# Patient Record
Sex: Male | Born: 1980 | Race: Black or African American | Hispanic: No | Marital: Single | Smoking: Current every day smoker
Health system: Southern US, Community
[De-identification: ages and names within clinical notes are randomized; demographics above are authoritative.]

## PROBLEM LIST (undated history)

## (undated) DIAGNOSIS — W3400XA Accidental discharge from unspecified firearms or gun, initial encounter: Secondary | ICD-10-CM

## (undated) DIAGNOSIS — F1910 Other psychoactive substance abuse, uncomplicated: Secondary | ICD-10-CM

## (undated) DIAGNOSIS — D751 Secondary polycythemia: Secondary | ICD-10-CM

## (undated) DIAGNOSIS — Z720 Tobacco use: Secondary | ICD-10-CM

## (undated) DIAGNOSIS — I82409 Acute embolism and thrombosis of unspecified deep veins of unspecified lower extremity: Secondary | ICD-10-CM

## (undated) DIAGNOSIS — F1010 Alcohol abuse, uncomplicated: Secondary | ICD-10-CM

## (undated) DIAGNOSIS — D759 Disease of blood and blood-forming organs, unspecified: Secondary | ICD-10-CM

## (undated) HISTORY — PX: OTHER SURGICAL HISTORY: SHX169

---
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
## 2007-08-29 ENCOUNTER — Emergency Department (HOSPITAL_COMMUNITY): Admission: EM | Admit: 2007-08-29 | Discharge: 2007-08-29 | Payer: Self-pay | Admitting: Emergency Medicine

## 2008-06-17 ENCOUNTER — Emergency Department (HOSPITAL_COMMUNITY): Admission: EM | Admit: 2008-06-17 | Discharge: 2008-06-17 | Payer: Self-pay | Admitting: Emergency Medicine

## 2010-04-21 LAB — GC/CHLAMYDIA PROBE AMP, GENITAL
Chlamydia, DNA Probe: NEGATIVE
GC Probe Amp, Genital: NEGATIVE

## 2010-04-21 LAB — URINALYSIS, ROUTINE W REFLEX MICROSCOPIC
Ketones, ur: 15 mg/dL — AB
Nitrite: NEGATIVE
Specific Gravity, Urine: 1.029 (ref 1.005–1.030)
Urobilinogen, UA: 1 mg/dL (ref 0.0–1.0)
pH: 6 (ref 5.0–8.0)

## 2010-04-21 LAB — DIFFERENTIAL
Basophils Absolute: 0.1 10*3/uL (ref 0.0–0.1)
Basophils Relative: 2 % — ABNORMAL HIGH (ref 0–1)
Lymphocytes Relative: 27 % (ref 12–46)
Neutro Abs: 3.6 10*3/uL (ref 1.7–7.7)

## 2010-04-21 LAB — CBC
Platelets: 129 10*3/uL — ABNORMAL LOW (ref 150–400)
RDW: 13 % (ref 11.5–15.5)

## 2010-04-21 LAB — BASIC METABOLIC PANEL
BUN: 9 mg/dL (ref 6–23)
Calcium: 9.4 mg/dL (ref 8.4–10.5)
GFR calc non Af Amer: >60 mL/min (ref >60)
Potassium: 3.8 mEq/L (ref 3.5–5.1)
Sodium: 141 mEq/L (ref 135–145)

## 2010-04-21 LAB — D-DIMER, QUANTITATIVE: D-Dimer, Quant: <0.22 ug/mL-FEU (ref 0.00–0.48)

## 2011-05-05 ENCOUNTER — Emergency Department (HOSPITAL_COMMUNITY)
Admission: EM | Admit: 2011-05-05 | Discharge: 2011-05-05 | Disposition: A | Discharge status: Home / Self Care | Payer: Self-pay | Attending: Emergency Medicine | Admitting: Emergency Medicine

## 2011-05-05 ENCOUNTER — Emergency Department (HOSPITAL_COMMUNITY): Payer: Self-pay

## 2011-05-05 ENCOUNTER — Encounter (HOSPITAL_COMMUNITY): Payer: Self-pay

## 2011-05-05 DIAGNOSIS — R0602 Shortness of breath: Secondary | ICD-10-CM | POA: Insufficient documentation

## 2011-05-05 DIAGNOSIS — R079 Chest pain, unspecified: Secondary | ICD-10-CM | POA: Insufficient documentation

## 2011-05-05 DIAGNOSIS — F17200 Nicotine dependence, unspecified, uncomplicated: Secondary | ICD-10-CM | POA: Insufficient documentation

## 2011-05-05 DIAGNOSIS — R002 Palpitations: Secondary | ICD-10-CM | POA: Insufficient documentation

## 2011-05-05 DIAGNOSIS — F1490 Cocaine use, unspecified, uncomplicated: Secondary | ICD-10-CM

## 2011-05-05 DIAGNOSIS — F1410 Cocaine abuse, uncomplicated: Secondary | ICD-10-CM | POA: Insufficient documentation

## 2011-05-05 LAB — POCT I-STAT TROPONIN I: Troponin i, poc: 0 ng/mL (ref 0.00–0.08)

## 2011-05-05 LAB — POCT I-STAT, CHEM 8
Chloride: 104 mEq/L (ref 96–112)
Glucose, Bld: 66 mg/dL — ABNORMAL LOW (ref 70–99)
HCT: 63 % — ABNORMAL HIGH (ref 39.0–52.0)
Hemoglobin: 21.4 g/dL (ref 13.0–17.0)
Potassium: 4.3 mEq/L (ref 3.5–5.1)
Sodium: 139 mEq/L (ref 135–145)

## 2011-05-05 LAB — DIFFERENTIAL
Basophils Absolute: 0 10*3/uL (ref 0.0–0.1)
Basophils Relative: 0 % (ref 0–1)
Eosinophils Absolute: 0.1 10*3/uL (ref 0.0–0.7)
Monocytes Relative: 10 % (ref 3–12)
Neutro Abs: 4.6 10*3/uL (ref 1.7–7.7)
Neutrophils Relative %: 71 % (ref 43–77)

## 2011-05-05 LAB — CBC
Hemoglobin: 21 g/dL — ABNORMAL HIGH (ref 13.0–17.0)
MCH: 34.4 pg — ABNORMAL HIGH (ref 26.0–34.0)
MCHC: 36.1 g/dL — ABNORMAL HIGH (ref 30.0–36.0)
Platelets: 152 10*3/uL (ref 150–400)

## 2011-05-05 NOTE — ED Notes (Signed)
Patient returned from X-ray 

## 2011-05-05 NOTE — ED Provider Notes (Signed)
4:00 PM Assumed care of patient from Juanita Laster, PA-C. Patient came in today with a chief complaint of chest pain and palpitations.  He has a history of this in the past.  He admits to using cocaine occasionally. Plan is for patient to be discharged if labs and CXR come back negative.  Reassessed patient.  Patient reports that his chest pain and palpitations have improved at this time.  Patient will be discharged home.  Patient in agreement with plan.  Patient informed of high hemoglobin and RBC.  Patient reports that he has had this in the past and was referred to a specialist and told he may need a bone marrow biopsy, which he did not get.  Pascal Lux [place], PA-C 05/05/11 2130

## 2011-05-05 NOTE — ED Notes (Signed)
Pt in from home with acute onset of chest pain states sob denies pain radiating denies nausea pt states pain is pressure like at the present time

## 2011-05-05 NOTE — ED Notes (Signed)
Critical value given to Dr. Effie Shy (I-Stat)

## 2011-05-05 NOTE — Discharge Instructions (Signed)
Read instructions below for reasons to return to the Emergency Department. It is recommended that your follow up with your Primary Care Doctor in regards to today's visit. If you do not have a doctor, use the resource guide listed below to help you find one. Avoid using cocaine.  Also make sure that you follow up with a primary care physician for your high hemoglobin and high red blood cell count.  You will most likely need a bone marrow.  Chest Pain (Nonspecific)  HOME CARE INSTRUCTIONS  For the next few days, avoid physical activities that bring on chest pain. Continue physical activities as directed.  Do not smoke cigarettes or drink alcohol until your symptoms are gone.  Only take over-the-counter or prescription medicine for pain, discomfort, or fever as directed by your caregiver.  Follow your caregiver's suggestions for further testing if your chest pain does not go away.  Keep any follow-up appointments you made. If you do not go to an appointment, you could develop lasting (chronic) problems with pain. If there is any problem keeping an appointment, you must call to reschedule.  SEEK MEDICAL CARE IF:  You think you are having problems from the medicine you are taking. Read your medicine instructions carefully.  Your chest pain does not go away, even after treatment.  You develop a rash with blisters on your chest.  SEEK IMMEDIATE MEDICAL CARE IF:  You have increased chest pain or pain that spreads to your arm, neck, jaw, back, or belly (abdomen).  You develop shortness of breath, an increasing cough, or you are coughing up blood.  You have severe back or abdominal pain, feel sick to your stomach (nauseous) or throw up (vomit).  You develop severe weakness, fainting, or chills.  You have an oral temperature above 102 F (38.9 C), not controlled by medicine.   THIS IS AN EMERGENCY. Do not wait to see if the pain will go away. Get medical help at once. Call your local emergency services  (911 in [place] Do not drive yourself to the hospital.   RESOURCE GUIDE  Dental Problems  Patients with Medicaid: [facility] [phone] [address]                                           [phone] [address] Phone:  [phone]                                                  Phone:  [phone]  If unable to pay or uninsured, contact:  [facility] or [facility]. to become qualified for the adult dental clinic.  Chronic Pain Problems Contact [facility]  [phone] Patients need to be referred by their primary care doctor.  Insufficient Money for Medicine Contact [employer]:  call "211" or [facility] [phone].  No Primary Care Doctor Call [facility]  [phone] Other agencies that provide inexpensive medical care    [facility]  [phone]    [facility]  [phone]    [facility]  [phone]  Women's Clinic  [phone]    [facility]  [phone]    [facility]  516-698-9016  Psychological Services [facility]  [phone] [facility]  [phone] [facility]   [phone] (emergency services (502)[phone])  Substance Abuse Resources [facility]  [phone] [facility] [phone] [facility] [place] [phone] Floydene Flock ([phone] Residential & Outpatient Substance Abuse Program  [phone]  Abuse/Neglect [facility] Child Abuse Hotline (405) 513-7807 [facility] Child Abuse Hotline 917-742-0416 (After Hours)  Emergency Shelter [facility] 502-245-2782  [facility] [phone] [facility] 9366923184  MRSA Hotline #:   365-821-7290    [facility] Resources  [facility]     [employer]                          [facility] Dept. [address] [place]                       [address]      [address]  Blondell Reveal Phone:  [phone]                                   Phone:  [phone]                 Phone:  [phone]  [facility] Phone:  [phone]  [facility] Child Abuse Hotline [phone])617-0038 [phone] (After Hours)

## 2011-05-05 NOTE — ED Provider Notes (Signed)
History     CSN: [id number]  Arrival date & time 05/05/11  1312   First MD Initiated Contact with Patient 05/05/11 1432      Chief Complaint  Patient presents with  . Chest Pain    (Consider location/radiation/quality/duration/timing/severity/associated sxs/prior treatment) Patient is a 31 y.o. male presenting with chest pain. The history is provided by the patient.  Chest Pain The chest pain began 3 - 5 hours ago. Associated with: He woke this morning with central chest discomfort and "skipping beats". The chest discomfort has resolved but palpitations persist.  Primary symptoms include shortness of breath and palpitations. Pertinent negatives for primary symptoms include no fever.  The palpitations also occurred with shortness of breath. Associated symptoms comments: He states that he has these episodes approximately 3 times per week. 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 He admits to recent and regular use of cocaine, last used 2 days ago.Marland Kitchen     History reviewed. No pertinent past medical history.  History reviewed. No pertinent past surgical history.  No family history on file.  History  Substance Use Topics  . Smoking status: Current Everyday Smoker  . Smokeless tobacco: Not on file  . Alcohol Use: Yes      Review of Systems  Constitutional: Negative for fever and chills.  Respiratory: Positive for shortness of breath.   Cardiovascular: Positive for chest pain and palpitations.  Gastrointestinal: Negative.   Musculoskeletal: Negative.   Skin: Negative.   Neurological: Negative.     Allergies  Food allergy formula  Home Medications   Current Outpatient Rx  Name Route Sig Dispense Refill  . ASPIRIN 325 MG PO TABS Oral Take 325 mg by mouth daily.      BP 121/83  Pulse 77  Resp 34  SpO2 98%  Physical Exam  ED Course  Procedures  (including critical care time)   Labs Reviewed  CBC  DIFFERENTIAL   No results found.   No diagnosis found.    MDM          Rodena Medin, PA-C 05/05/11 1527

## 2011-05-07 NOTE — ED Provider Notes (Signed)
Medical screening examination/treatment/procedure(s) were performed by non-physician practitioner and as supervising physician I was immediately available for consultation/collaboration.  Flint Melter, MD 05/07/11 [phone]

## 2011-05-07 NOTE — ED Provider Notes (Signed)
Medical screening examination/treatment/procedure(s) were performed by non-physician practitioner and as supervising physician I was immediately available for consultation/collaboration.  Flint Melter, MD 05/07/11 1253

## 2012-09-29 ENCOUNTER — Emergency Department (HOSPITAL_COMMUNITY)
Admission: EM | Admit: 2012-09-29 | Discharge: 2012-09-29 | Disposition: A | Discharge status: Home / Self Care | Payer: Medicaid Other | Attending: Emergency Medicine | Admitting: Emergency Medicine

## 2012-09-29 ENCOUNTER — Encounter (HOSPITAL_COMMUNITY): Payer: Self-pay | Admitting: Emergency Medicine

## 2012-09-29 ENCOUNTER — Emergency Department (HOSPITAL_COMMUNITY): Payer: Medicaid Other

## 2012-09-29 DIAGNOSIS — Z7982 Long term (current) use of aspirin: Secondary | ICD-10-CM | POA: Insufficient documentation

## 2012-09-29 DIAGNOSIS — F1010 Alcohol abuse, uncomplicated: Secondary | ICD-10-CM | POA: Insufficient documentation

## 2012-09-29 DIAGNOSIS — D45 Polycythemia vera: Secondary | ICD-10-CM | POA: Insufficient documentation

## 2012-09-29 DIAGNOSIS — R000 Tachycardia, unspecified: Secondary | ICD-10-CM | POA: Insufficient documentation

## 2012-09-29 DIAGNOSIS — Z87828 Personal history of other (healed) physical injury and trauma: Secondary | ICD-10-CM | POA: Insufficient documentation

## 2012-09-29 DIAGNOSIS — R079 Chest pain, unspecified: Secondary | ICD-10-CM | POA: Insufficient documentation

## 2012-09-29 DIAGNOSIS — F1410 Cocaine abuse, uncomplicated: Secondary | ICD-10-CM | POA: Insufficient documentation

## 2012-09-29 DIAGNOSIS — F1210 Cannabis abuse, uncomplicated: Secondary | ICD-10-CM | POA: Insufficient documentation

## 2012-09-29 DIAGNOSIS — F17200 Nicotine dependence, unspecified, uncomplicated: Secondary | ICD-10-CM | POA: Insufficient documentation

## 2012-09-29 HISTORY — DX: Accidental discharge from unspecified firearms or gun, initial encounter: W34.00XA

## 2012-09-29 LAB — POCT I-STAT, CHEM 8
BUN: 6 mg/dL (ref 6–23)
Calcium, Ion: 1.03 mmol/L — ABNORMAL LOW (ref 1.12–1.23)
HCT: 69 % — ABNORMAL HIGH (ref 39.0–52.0)
Hemoglobin: 23.5 g/dL (ref 13.0–17.0)
Sodium: 139 mEq/L (ref 135–145)
TCO2: 22 mmol/L (ref 0–100)

## 2012-09-29 LAB — CBC WITH DIFFERENTIAL/PLATELET
Basophils Absolute: 0 10*3/uL (ref 0.0–0.1)
Basophils Relative: 0 % (ref 0–1)
Eosinophils Relative: 2 % (ref 0–5)
HCT: 65.6 % — ABNORMAL HIGH (ref 39.0–52.0)
Lymphocytes Relative: 30 % (ref 12–46)
MCHC: 36.4 g/dL — ABNORMAL HIGH (ref 30.0–36.0)
Monocytes Absolute: 0.8 10*3/uL (ref 0.1–1.0)
Neutro Abs: 3.1 10*3/uL (ref 1.7–7.7)
Platelets: 108 10*3/uL — ABNORMAL LOW (ref 150–400)
RDW: 14 % (ref 11.5–15.5)
WBC: 5.7 10*3/uL (ref 4.0–10.5)

## 2012-09-29 LAB — BASIC METABOLIC PANEL
Chloride: 97 mEq/L (ref 96–112)
Creatinine, Ser: 0.9 mg/dL (ref 0.50–1.35)
GFR calc Af Amer: >90 mL/min (ref >90)
Potassium: 4.2 mEq/L (ref 3.5–5.1)
Sodium: 136 mEq/L (ref 135–145)

## 2012-09-29 LAB — HEPATIC FUNCTION PANEL
ALT: 44 U/L (ref 0–53)
Albumin: 3.6 g/dL (ref 3.5–5.2)
Indirect Bilirubin: 0.7 mg/dL (ref 0.3–0.9)
Total Protein: 7.7 g/dL (ref 6.0–8.3)

## 2012-09-29 LAB — POCT I-STAT TROPONIN I: Troponin i, poc: 0.01 ng/mL (ref 0.00–0.08)

## 2012-09-29 MED ORDER — ASPIRIN 81 MG PO CHEW: 81.0000 mg | CHEWABLE_TABLET | Freq: Every day | ORAL | Status: DC

## 2012-09-29 MED ORDER — LORAZEPAM 2 MG/ML IJ SOLN
1.0000 mg | Freq: Once | INTRAMUSCULAR | Status: AC
Start: 2012-09-29 — End: 2012-09-29
  Administered 2012-09-29: 1 mg via INTRAVENOUS
  Filled 2012-09-29: qty 1

## 2012-09-29 MED ORDER — ASPIRIN 325 MG PO TABS
325.0000 mg | ORAL_TABLET | Freq: Once | ORAL | Status: AC
  Administered 2012-09-29: 325 mg via ORAL
  Filled 2012-09-29: qty 1

## 2012-09-29 MED ORDER — SODIUM CHLORIDE 0.9 % IV BOLUS (SEPSIS)
1000.0000 mL | Freq: Once | INTRAVENOUS | Status: AC
  Administered 2012-09-29: 1000 mL via INTRAVENOUS
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
## 2012-09-29 NOTE — ED Notes (Signed)
Pt states he has intermittent numbness and tingling to face/neck and L arm x 2 weeks. Pt states he awoke from sleep tonight with "blank" feeling. Pt states he does drink, smoke marijuana and snort cocaine but does not feel this is related. Pt states he is having "twitching" to face.

## 2012-09-29 NOTE — ED Notes (Signed)
EKG given to EDP, Dierdre Highman, MD.

## 2012-09-29 NOTE — ED Provider Notes (Signed)
Medical screening examination/treatment/procedure(s) were performed by non-physician practitioner and as supervising physician I was immediately available for consultation/collaboration.  Sunnie Nielsen, MD 09/29/12 [phone]

## 2012-09-30 ENCOUNTER — Telehealth: Payer: Self-pay | Admitting: Internal Medicine

## 2012-09-30 ENCOUNTER — Observation Stay (HOSPITAL_COMMUNITY): Payer: Medicaid Other

## 2012-09-30 ENCOUNTER — Inpatient Hospital Stay (HOSPITAL_COMMUNITY)
Admission: EM | Admit: 2012-09-30 | Discharge: 2012-10-03 | DRG: 842 | Disposition: A | Discharge status: Home / Self Care | Payer: Medicaid Other | Attending: Family Medicine | Admitting: Family Medicine

## 2012-09-30 ENCOUNTER — Encounter (HOSPITAL_COMMUNITY): Payer: Self-pay | Admitting: *Deleted

## 2012-09-30 ENCOUNTER — Emergency Department (HOSPITAL_COMMUNITY): Payer: Medicaid Other

## 2012-09-30 DIAGNOSIS — F329 Major depressive disorder, single episode, unspecified: Secondary | ICD-10-CM | POA: Diagnosis present

## 2012-09-30 DIAGNOSIS — Z6826 Body mass index (BMI) 26.0-26.9, adult: Secondary | ICD-10-CM

## 2012-09-30 DIAGNOSIS — F411 Generalized anxiety disorder: Secondary | ICD-10-CM | POA: Diagnosis present

## 2012-09-30 DIAGNOSIS — D45 Polycythemia vera: Principal | ICD-10-CM

## 2012-09-30 DIAGNOSIS — Z720 Tobacco use: Secondary | ICD-10-CM

## 2012-09-30 DIAGNOSIS — F1910 Other psychoactive substance abuse, uncomplicated: Secondary | ICD-10-CM

## 2012-09-30 DIAGNOSIS — Z224 Carrier of infections with a predominantly sexual mode of transmission: Secondary | ICD-10-CM

## 2012-09-30 DIAGNOSIS — H539 Unspecified visual disturbance: Secondary | ICD-10-CM | POA: Diagnosis present

## 2012-09-30 DIAGNOSIS — D751 Secondary polycythemia: Secondary | ICD-10-CM

## 2012-09-30 DIAGNOSIS — F1210 Cannabis abuse, uncomplicated: Secondary | ICD-10-CM | POA: Diagnosis present

## 2012-09-30 DIAGNOSIS — F17200 Nicotine dependence, unspecified, uncomplicated: Secondary | ICD-10-CM | POA: Diagnosis present

## 2012-09-30 DIAGNOSIS — D72829 Elevated white blood cell count, unspecified: Secondary | ICD-10-CM | POA: Diagnosis present

## 2012-09-30 DIAGNOSIS — Z7982 Long term (current) use of aspirin: Secondary | ICD-10-CM

## 2012-09-30 DIAGNOSIS — F1410 Cocaine abuse, uncomplicated: Secondary | ICD-10-CM | POA: Diagnosis present

## 2012-09-30 DIAGNOSIS — F419 Anxiety disorder, unspecified: Secondary | ICD-10-CM

## 2012-09-30 DIAGNOSIS — F1010 Alcohol abuse, uncomplicated: Secondary | ICD-10-CM

## 2012-09-30 DIAGNOSIS — E440 Moderate protein-calorie malnutrition: Secondary | ICD-10-CM | POA: Diagnosis present

## 2012-09-30 DIAGNOSIS — F3289 Other specified depressive episodes: Secondary | ICD-10-CM | POA: Diagnosis present

## 2012-09-30 HISTORY — DX: Secondary polycythemia: D75.1

## 2012-09-30 LAB — CBC
Hemoglobin: 22.1 g/dL (ref 13.0–17.0)
MCV: 91.8 fL (ref 78.0–100.0)
Platelets: 133 10*3/uL — ABNORMAL LOW (ref 150–400)
RBC: 6.55 MIL/uL — ABNORMAL HIGH (ref 4.22–5.81)
WBC: 5.6 10*3/uL (ref 4.0–10.5)

## 2012-09-30 LAB — COMPREHENSIVE METABOLIC PANEL
ALT: 45 U/L (ref 0–53)
AST: 66 U/L — ABNORMAL HIGH (ref 0–37)
Albumin: 3.4 g/dL — ABNORMAL LOW (ref 3.5–5.2)
Calcium: 9.1 mg/dL (ref 8.4–10.5)
Sodium: 137 mEq/L (ref 135–145)
Total Protein: 7.1 g/dL (ref 6.0–8.3)

## 2012-09-30 LAB — HEMATOCRIT: HCT: 58.7 % — ABNORMAL HIGH (ref 39.0–52.0)

## 2012-09-30 LAB — URINALYSIS, ROUTINE W REFLEX MICROSCOPIC
Leukocytes, UA: NEGATIVE
Nitrite: NEGATIVE
Specific Gravity, Urine: 1.019 (ref 1.005–1.030)
pH: 6 (ref 5.0–8.0)

## 2012-09-30 LAB — POCT I-STAT TROPONIN I

## 2012-09-30 MED ORDER — HEPARIN SODIUM (PORCINE) 5000 UNIT/ML IJ SOLN
5000.0000 [IU] | Freq: Three times a day (TID) | INTRAMUSCULAR | Status: DC
  Administered 2012-09-30 – 2012-10-03 (×8): 5000 [IU] via SUBCUTANEOUS
  Filled 2012-09-30 (×11): qty 1

## 2012-09-30 MED ORDER — DEXTROSE-NACL 5-0.45 % IV SOLN
INTRAVENOUS | Status: DC
  Administered 2012-09-30: 980 mL via INTRAVENOUS
  Administered 2012-10-01 – 2012-10-03 (×2): via INTRAVENOUS

## 2012-09-30 MED ORDER — ADULT MULTIVITAMIN W/MINERALS CH
1.0000 | ORAL_TABLET | Freq: Every day | ORAL | Status: DC
  Administered 2012-09-30 – 2012-10-03 (×4): 1 via ORAL
  Filled 2012-09-30 (×4): qty 1

## 2012-09-30 MED ORDER — LORAZEPAM 2 MG/ML IJ SOLN
1.0000 mg | Freq: Four times a day (QID) | INTRAMUSCULAR | Status: DC | PRN
  Administered 2012-09-30 – 2012-10-01 (×2): 1 mg via INTRAVENOUS
  Filled 2012-09-30 (×2): qty 1

## 2012-09-30 MED ORDER — PNEUMOCOCCAL VAC POLYVALENT 25 MCG/0.5ML IJ INJ
0.5000 mL | INJECTION | INTRAMUSCULAR | Status: AC
  Filled 2012-09-30 (×2): qty 0.5

## 2012-09-30 MED ORDER — LORAZEPAM 1 MG PO TABS
1.0000 mg | ORAL_TABLET | Freq: Four times a day (QID) | ORAL | Status: DC | PRN
  Administered 2012-10-01: 1 mg via ORAL
  Filled 2012-09-30: qty 1

## 2012-09-30 MED ORDER — LORAZEPAM 2 MG/ML IJ SOLN
1.0000 mg | Freq: Once | INTRAMUSCULAR | Status: AC
  Administered 2012-09-30: 1 mg via INTRAVENOUS
  Filled 2012-09-30: qty 1

## 2012-09-30 MED ORDER — FOLIC ACID 1 MG PO TABS
1.0000 mg | ORAL_TABLET | Freq: Every day | ORAL | Status: DC
  Administered 2012-09-30 – 2012-10-03 (×4): 1 mg via ORAL
  Filled 2012-09-30 (×4): qty 1

## 2012-09-30 MED ORDER — ACETAMINOPHEN 500 MG PO TABS
500.0000 mg | ORAL_TABLET | Freq: Four times a day (QID) | ORAL | Status: DC | PRN
  Administered 2012-09-30 – 2012-10-02 (×2): 500 mg via ORAL
  Filled 2012-09-30 (×3): qty 1

## 2012-09-30 MED ORDER — ASPIRIN 81 MG PO CHEW
81.0000 mg | CHEWABLE_TABLET | Freq: Every day | ORAL | Status: DC
  Administered 2012-09-30 – 2012-10-03 (×4): 81 mg via ORAL
  Filled 2012-09-30 (×4): qty 1

## 2012-09-30 MED ORDER — THIAMINE HCL 100 MG/ML IJ SOLN
100.0000 mg | Freq: Every day | INTRAMUSCULAR | Status: DC
  Filled 2012-09-30 (×4): qty 1

## 2012-09-30 MED ORDER — INFLUENZA VAC SPLIT QUAD 0.5 ML IM SUSP
0.5000 mL | INTRAMUSCULAR | Status: AC
  Filled 2012-09-30 (×2): qty 0.5

## 2012-09-30 MED ORDER — ASPIRIN 81 MG PO CHEW: 81.0000 mg | CHEWABLE_TABLET | Freq: Every day | ORAL | Status: DC

## 2012-09-30 MED ORDER — HYDROXYUREA 500 MG PO CAPS: 500.0000 mg | ORAL_CAPSULE | Freq: Every day | ORAL | Status: DC

## 2012-09-30 MED ORDER — VITAMIN B-1 100 MG PO TABS
100.0000 mg | ORAL_TABLET | Freq: Every day | ORAL | Status: DC
  Administered 2012-09-30 – 2012-10-03 (×4): 100 mg via ORAL
  Filled 2012-09-30 (×4): qty 1

## 2012-09-30 NOTE — Telephone Encounter (Signed)
S/W PT [facility]IFE [facility] NP APPT 10/09 @ 8 W/DR. CHISM DX-POLYCYTHEMIA VERA  WELCOME PACKET MAILED.

## 2012-09-30 NOTE — Progress Notes (Signed)
PICC line ordered per Dr. Mahala Menghini.  Interventional Radiology will be unable to do this.  Dr. Mahala Menghini spoke with the IV nurse and the IV nurse will be able to place PICC line in the am.  Jerric, [facility]  09/30/2012  6:22 PM

## 2012-09-30 NOTE — ED Notes (Addendum)
Pt seen in ED 9/18 for numbness, dx with polcythemia. Pt came back to ED today with continued numbness and chest pain 9/10. Pt reports for 2 weeks 2 fingers on left hand were numb. Now pt has arm numbness and numbness on right side of face. Pt reports dizziness and nausea.
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
## 2012-09-30 NOTE — Consult Note (Signed)
#   [id number] is consult note.  Jeffery E.

## 2012-09-30 NOTE — Progress Notes (Signed)
Patient admitted to rm 1312,alert and orientedx4, ambulatory.Hulda Marin RN.

## 2012-09-30 NOTE — ED Notes (Signed)
Report given to laura,rn
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
## 2012-09-30 NOTE — Progress Notes (Signed)
Attempted to do phlebotomy in each A/C without success.   Get blood return however it clots quickly.   When 16 gauge needle withdrawn the blood is clotted in the end.   Spoke with Dr. Mahala Menghini regarding my attempts.

## 2012-09-30 NOTE — Telephone Encounter (Signed)
C/D 09/30/12 for appt. 10/9

## 2012-09-30 NOTE — ED Notes (Signed)
Pt alert and oriented x4. Respirations even and unlabored, bilateral symmetrical rise and fall of chest. Skin warm and dry. In no acute distress. Denies needs.   

## 2012-10-01 ENCOUNTER — Observation Stay (HOSPITAL_COMMUNITY): Payer: Medicaid Other

## 2012-10-01 DIAGNOSIS — Z224 Carrier of infections with a predominantly sexual mode of transmission: Secondary | ICD-10-CM

## 2012-10-01 LAB — HIV ANTIBODY (ROUTINE TESTING W REFLEX): HIV: NONREACTIVE

## 2012-10-01 LAB — CBC
HCT: 58.7 % — ABNORMAL HIGH (ref 39.0–52.0)
MCH: 33.2 pg (ref 26.0–34.0)
MCHC: 35.9 g/dL (ref 30.0–36.0)
MCV: 92.4 fL (ref 78.0–100.0)
RDW: 13.7 % (ref 11.5–15.5)

## 2012-10-01 LAB — SAVE SMEAR

## 2012-10-01 LAB — FERRITIN: Ferritin: 359 ng/mL — ABNORMAL HIGH (ref 22–322)

## 2012-10-01 LAB — HEMOGLOBIN AND HEMATOCRIT, BLOOD: Hemoglobin: 20.2 g/dL — ABNORMAL HIGH (ref 13.0–17.0)

## 2012-10-01 MED ORDER — LORAZEPAM 1 MG PO TABS
1.0000 mg | ORAL_TABLET | ORAL | Status: DC | PRN
  Administered 2012-10-01 (×2): 1 mg via ORAL
  Filled 2012-10-01 (×3): qty 1

## 2012-10-01 MED ORDER — LORAZEPAM 2 MG/ML IJ SOLN
INTRAMUSCULAR | Status: AC
  Filled 2012-10-01: qty 1

## 2012-10-01 MED ORDER — LORAZEPAM 2 MG/ML IJ SOLN
1.0000 mg | Freq: Four times a day (QID) | INTRAMUSCULAR | Status: DC | PRN
  Filled 2012-10-01: qty 1

## 2012-10-01 MED ORDER — LORAZEPAM 2 MG/ML IJ SOLN: 0.0000 mg | Freq: Two times a day (BID) | INTRAMUSCULAR | Status: DC

## 2012-10-01 MED ORDER — SODIUM CHLORIDE 0.9 % IJ SOLN
10.0000 mL | Freq: Two times a day (BID) | INTRAMUSCULAR | Status: DC
  Administered 2012-10-01 – 2012-10-02 (×2): 10 mL

## 2012-10-01 MED ORDER — SODIUM CHLORIDE 0.9 % IJ SOLN: 10.0000 mL | INTRAMUSCULAR | Status: DC | PRN

## 2012-10-01 MED ORDER — LORAZEPAM 2 MG/ML IJ SOLN
0.0000 mg | Freq: Four times a day (QID) | INTRAMUSCULAR | Status: DC
  Administered 2012-10-01: 2 mg via INTRAVENOUS
  Administered 2012-10-02 – 2012-10-03 (×6): 1 mg via INTRAVENOUS
  Filled 2012-10-01 (×6): qty 1

## 2012-10-01 NOTE — Progress Notes (Signed)
Peripherally Inserted Central Catheter/Midline Placement  The IV Nurse has discussed with the patient and/or persons authorized to consent for the patient, the purpose of this procedure and the potential benefits and risks involved with this procedure.  The benefits include less needle sticks, lab draws from the catheter and patient may be discharged home with the catheter.  Risks include, but not limited to, infection, bleeding, blood clot (thrombus formation), and puncture of an artery; nerve damage and irregular heat beat.  Alternatives to this procedure were also discussed.  PICC/Midline Placement Documentation  PICC / Midline Single Lumen 10/01/12 PICC Right Basilic (Active)  Indication for Insertion or Continuance of Line Chronic illness with exacerbations (CF, Sickle Cell, etc.) 10/01/2012  9:05 AM  Length mark (cm) 0 cm 10/01/2012  9:05 AM  Site Assessment Clean;Dry;Intact 10/01/2012  9:05 AM  Line Status Flushed;Saline locked;Blood return noted 10/01/2012  9:05 AM  Dressing Type Transparent 10/01/2012  9:05 AM  Dressing Status Clean;Dry;Intact 10/01/2012  9:05 AM  Dressing Intervention New dressing 10/01/2012  9:05 AM  Dressing Change Due 10/08/12 10/01/2012  9:05 AM       Ethelda Chick 10/01/2012, 9:28 AM

## 2012-10-01 NOTE — Progress Notes (Signed)
IV removed by pt during sleep, pt requests IV left out so he "wont tear it out in my sleep again". PICC scheduled for am, reminded pt about need for increased PO fluids, pt states understanding.
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
## 2012-10-01 NOTE — Progress Notes (Signed)
   CARE MANAGEMENT NOTE 10/01/2012  Patient:  KREED, KAUFFMAN   Account Number:  [id number]  Date Initiated:  10/01/2012  Documentation initiated by:  [facility]  Subjective/Objective Assessment:   polycythemia     Action/Plan:   no insurance  lives with girlfriend, Cherene Julian # [phone]   Anticipated DC Date:  10/03/2012   Anticipated DC Plan:  HOME/SELF CARE      DC Planning Services  CM consult  PCP issues      Choice offered to / List presented to:             Status of service:  In process, will continue to follow Medicare Important Message given?   (If response is "NO", the following Medicare IM given date fields will be blank) Date Medicare IM given:   Date Additional Medicare IM given:    Discharge Disposition:    Per UR Regulation:    If discussed at Long Length of Stay Meetings, dates discussed:    Comments:  10/01/2012 1200 NCM spoke to pt and he is currently unemployed. He does not have any insurance at this time. Pt has small children in the home. Pt states he has not applied for Medicaid. Attending is requesting appt with [facility]. Will follow up with arranging appt on 10/03/2012 with pt for follow up with MD. Isidoro Donning RN CCM Case Mgmt phone [phone]
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
## 2012-10-01 NOTE — Progress Notes (Signed)
Jeffery Ramos is doing better. His peripheral IV came out this morning. He was able to get IV fluids last night. This may him feel better.  His ultrasound shows no hepatic or renal issues. He does have gallstones.  He is HIV negative.  The iron studies showed ferritin 03/14/1956 with iron saturation 80%.  Again he feels better. There is no headache. He's had no nausea vomiting.  Vital signs all are good. Blood pressure 131/82. Temperature 97.8.  No changes on physical exam. Lungs are clear. Cardiac exam regular rate and rhythm. Abdomen is soft. There is no palpable hepato- splenomegaly. Extremities shows no clubbing cyanosis or edema. Neurological exam is nonfocal.  His labs shows hemoglobin to be 21.1 and hematocrit to be 58.7. White cell count 5.4.  I looked at his blood smear. I do not see any nucleated red cells. There may have been a couple of target cells. No rouleau formation. White cells and platelets look adequate.  He is to have a PICC line placed. He needs be phlebotomized daily for 3 days. Then he can be discharged. I would make sure that he continues to get his IV fluids.  He would like some help with stopping alcohol use. I am sure the hospitalist will be able to help with this.  He will continue the baby aspirin.  Am glad that he is feeling better.  Pete E.

## 2012-10-02 LAB — GC/CHLAMYDIA PROBE AMP: CT Probe RNA: NEGATIVE

## 2012-10-02 MED ORDER — ENSURE COMPLETE PO LIQD
237.0000 mL | Freq: Two times a day (BID) | ORAL | Status: DC
  Administered 2012-10-02 – 2012-10-03 (×2): 237 mL via ORAL

## 2012-10-02 MED ORDER — TRAZODONE HCL 50 MG PO TABS
50.0000 mg | ORAL_TABLET | Freq: Every day | ORAL | Status: DC
  Administered 2012-10-02: 50 mg via ORAL
  Filled 2012-10-02 (×2): qty 1
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
## 2012-10-02 NOTE — Progress Notes (Signed)
Patient complains of increased aniexty, tremors related to alcohol withdrawal. CIWA score 18. Hospitalist on call made aware, and new orders received.

## 2012-10-03 DIAGNOSIS — E440 Moderate protein-calorie malnutrition: Secondary | ICD-10-CM | POA: Insufficient documentation

## 2012-10-03 LAB — CBC WITH DIFFERENTIAL/PLATELET
Eosinophils Relative: 4 % (ref 0–5)
HCT: 51 % (ref 39.0–52.0)
Lymphs Abs: 1.3 10*3/uL (ref 0.7–4.0)
MCH: 33.2 pg (ref 26.0–34.0)
MCV: 94.6 fL (ref 78.0–100.0)
Monocytes Absolute: 0.7 10*3/uL (ref 0.1–1.0)
Monocytes Relative: 15 % — ABNORMAL HIGH (ref 3–12)
Neutro Abs: 2.2 10*3/uL (ref 1.7–7.7)
Neutrophils Relative %: 52 % (ref 43–77)
Platelets: 109 10*3/uL — ABNORMAL LOW (ref 150–400)
RBC: 5.39 MIL/uL (ref 4.22–5.81)
WBC: 4.4 10*3/uL (ref 4.0–10.5)

## 2012-10-03 LAB — COMPREHENSIVE METABOLIC PANEL
ALT: 57 U/L — ABNORMAL HIGH (ref 0–53)
BUN: 6 mg/dL (ref 6–23)
CO2: 27 mEq/L (ref 19–32)
Calcium: 8.5 mg/dL (ref 8.4–10.5)
Creatinine, Ser: 0.9 mg/dL (ref 0.50–1.35)
GFR calc Af Amer: >90 mL/min (ref >90)
GFR calc non Af Amer: >90 mL/min (ref >90)
Glucose, Bld: 82 mg/dL (ref 70–99)
Potassium: 3.6 mEq/L (ref 3.5–5.1)
Sodium: 137 mEq/L (ref 135–145)
Total Bilirubin: 0.6 mg/dL (ref 0.3–1.2)
Total Protein: 6 g/dL (ref 6.0–8.3)

## 2012-10-03 LAB — HEMOGLOBIN AND HEMATOCRIT, BLOOD
HCT: 49.8 % (ref 39.0–52.0)
Hemoglobin: 17.7 g/dL — ABNORMAL HIGH (ref 13.0–17.0)

## 2012-10-03 MED ORDER — ENSURE COMPLETE PO LIQD: 237.0000 mL | Freq: Two times a day (BID) | ORAL | Status: DC

## 2012-10-03 MED ORDER — FOLIC ACID 1 MG PO TABS: 1.0000 mg | ORAL_TABLET | Freq: Every day | ORAL | Status: DC

## 2012-10-03 MED ORDER — ADULT MULTIVITAMIN W/MINERALS CH: 1.0000 | ORAL_TABLET | Freq: Every day | ORAL | Status: DC

## 2012-10-03 MED ORDER — TRAZODONE HCL 50 MG PO TABS: 100.0000 mg | ORAL_TABLET | Freq: Every day | ORAL | Status: DC

## 2012-10-03 NOTE — Progress Notes (Signed)
350 theraputic phlebotomy done, v/s tken by nurse tech, no c/o of dizziness or chest pain, RN made aware

## 2012-10-03 NOTE — Progress Notes (Signed)
Jeffery Ramos is feeling better. He has been getting phlebotomized over the weekend. He has no headaches. He feels more alert. His appetite is good. There is no nausea vomiting.  He had a PICC line placed. This has helped quite a bit with his phlebotomies. He is getting IV fluids which have helped. He is on aspirin now.  There is no fever. He's had no cough or shortness of breath. He's had no joint aches or pains.  On his physical exam vital signs show temperature 90.8 pulse 85 is markedly 20 blood pressure 113/67. Head exam shows no scleral icterus. He has no oral lesions. There is no adenopathy in his neck. Lungs are clear. Cardiac exam regular in rhythm with a normal S1 and S2. His no murmurs. Abdomen is soft. Is no palpable dominant mass. Is no fluid wave. There is no palpable hepato- splenomegaly.  Extremities shows no clubbing cyanosis or edema. Neurological exam no focal neurological deficits.  His laboratory today shows a hemoglobin of 17.7 with a hematocrit of 49.8. Platelet count is 109. His albumin is 2.7.  I would phlebotomized in today. Then he could be discharged. I would make sure that his PICC line stays out. We can follow him up in our office. Please make sure that he gets his aspirin.  I again told him that a lot of this is being driven by his use of recreational drugs and alcohol. Hopefully, he can get help with these issues. I feel these are very important for his overall health and again any resources that he could use to try to stop this usage of recreational drugs would be very helpful.  He has gotten great care on 3 E. by the hospitalist and the staff!!!  Pete E.

## 2012-10-03 NOTE — Care Management Note (Signed)
Cm scheduled pt a follow up appointment with [facility] as previously documented by weekend Cm. Appointment scheduled for 10/14/2012 at 3:30 pm with Dr.Johnson. Pt advised to arrive 1 hour earlier at 2:30pm to apply for orange card. No other barriers identified at this time.    Roxy Manns Ayra Hodgdon,RN,MSN [phone]
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
## 2012-10-03 NOTE — Progress Notes (Signed)
Pt. Was discharged home. His PICC line was removed by IV team nurse.  He was given his discharge instructions, prescriptions, and all questions were answered.  He was taken to main entrance in a wheelchair where he was transported home by his brother.

## 2012-10-05 ENCOUNTER — Other Ambulatory Visit: Payer: Self-pay | Admitting: Hematology & Oncology

## 2012-10-05 LAB — JAK2 GENOTYPR: JAK2 GenotypR: NOT DETECTED

## 2012-10-05 NOTE — ED Provider Notes (Signed)
Medical screening examination/treatment/procedure(s) were performed by non-physician practitioner and as supervising physician I was immediately available for consultation/collaboration.   Gwyneth Sprout, MD 10/05/12 1755

## 2012-10-06 ENCOUNTER — Encounter: Payer: Self-pay | Admitting: Hematology & Oncology

## 2012-10-06 ENCOUNTER — Telehealth: Payer: Self-pay | Admitting: Hematology & Oncology

## 2012-10-06 NOTE — Telephone Encounter (Signed)
Unable to contact pt at any number listed. Mailed letter for pt to call and schedule appointment

## 2012-10-06 NOTE — Telephone Encounter (Signed)
Per in basket called to schedule follow up, home number is wrong and cell is not working. Pt has been schedule for [facility] not sure if he knows. MD aware

## 2012-10-10 ENCOUNTER — Ambulatory Visit: Payer: Self-pay

## 2012-10-14 ENCOUNTER — Ambulatory Visit: Payer: Self-pay

## 2012-10-14 ENCOUNTER — Ambulatory Visit: Payer: Self-pay | Admitting: Family Medicine

## 2012-10-19 ENCOUNTER — Other Ambulatory Visit: Payer: Self-pay | Admitting: Internal Medicine

## 2012-10-19 DIAGNOSIS — D751 Secondary polycythemia: Secondary | ICD-10-CM

## 2012-10-20 ENCOUNTER — Ambulatory Visit: Payer: Self-pay

## 2012-11-11 ENCOUNTER — Telehealth: Payer: Self-pay | Admitting: Dietician

## 2012-11-26 ENCOUNTER — Encounter (HOSPITAL_COMMUNITY): Payer: Self-pay | Admitting: Emergency Medicine

## 2012-11-26 ENCOUNTER — Emergency Department (HOSPITAL_COMMUNITY)
Admission: EM | Admit: 2012-11-26 | Discharge: 2012-11-26 | Discharge status: Against Medical Advice | Payer: Medicaid Other | Source: Home / Self Care | Attending: Emergency Medicine | Admitting: Emergency Medicine

## 2012-11-26 DIAGNOSIS — E876 Hypokalemia: Secondary | ICD-10-CM

## 2012-11-26 DIAGNOSIS — D751 Secondary polycythemia: Secondary | ICD-10-CM

## 2012-11-26 DIAGNOSIS — Z87828 Personal history of other (healed) physical injury and trauma: Secondary | ICD-10-CM | POA: Insufficient documentation

## 2012-11-26 DIAGNOSIS — R209 Unspecified disturbances of skin sensation: Secondary | ICD-10-CM | POA: Insufficient documentation

## 2012-11-26 DIAGNOSIS — Z7982 Long term (current) use of aspirin: Secondary | ICD-10-CM | POA: Insufficient documentation

## 2012-11-26 DIAGNOSIS — IMO0002 Reserved for concepts with insufficient information to code with codable children: Secondary | ICD-10-CM | POA: Insufficient documentation

## 2012-11-26 DIAGNOSIS — F1010 Alcohol abuse, uncomplicated: Secondary | ICD-10-CM | POA: Insufficient documentation

## 2012-11-26 DIAGNOSIS — F1910 Other psychoactive substance abuse, uncomplicated: Secondary | ICD-10-CM

## 2012-11-26 DIAGNOSIS — R079 Chest pain, unspecified: Secondary | ICD-10-CM | POA: Insufficient documentation

## 2012-11-26 DIAGNOSIS — F1410 Cocaine abuse, uncomplicated: Secondary | ICD-10-CM | POA: Insufficient documentation

## 2012-11-26 DIAGNOSIS — F1210 Cannabis abuse, uncomplicated: Secondary | ICD-10-CM | POA: Insufficient documentation

## 2012-11-26 DIAGNOSIS — R0602 Shortness of breath: Secondary | ICD-10-CM | POA: Insufficient documentation

## 2012-11-26 DIAGNOSIS — F17200 Nicotine dependence, unspecified, uncomplicated: Secondary | ICD-10-CM | POA: Insufficient documentation

## 2012-11-26 DIAGNOSIS — F1510 Other stimulant abuse, uncomplicated: Secondary | ICD-10-CM | POA: Insufficient documentation

## 2012-11-26 DIAGNOSIS — R159 Full incontinence of feces: Secondary | ICD-10-CM | POA: Insufficient documentation

## 2012-11-26 DIAGNOSIS — D45 Polycythemia vera: Secondary | ICD-10-CM | POA: Insufficient documentation

## 2012-11-26 DIAGNOSIS — R61 Generalized hyperhidrosis: Secondary | ICD-10-CM | POA: Insufficient documentation

## 2012-11-26 LAB — CBC WITH DIFFERENTIAL/PLATELET
Basophils Absolute: 0 10*3/uL (ref 0.0–0.1)
Eosinophils Relative: 1 % (ref 0–5)
Lymphocytes Relative: 27 % (ref 12–46)
Lymphs Abs: 2.4 10*3/uL (ref 0.7–4.0)
MCV: 95 fL (ref 78.0–100.0)
Monocytes Absolute: 1.5 10*3/uL — ABNORMAL HIGH (ref 0.1–1.0)
Neutro Abs: 4.9 10*3/uL (ref 1.7–7.7)
Neutrophils Relative %: 56 % (ref 43–77)
Platelets: 164 10*3/uL (ref 150–400)
RBC: 6.34 MIL/uL — ABNORMAL HIGH (ref 4.22–5.81)
RDW: 14.2 % (ref 11.5–15.5)
WBC: 8.9 10*3/uL (ref 4.0–10.5)

## 2012-11-26 LAB — BASIC METABOLIC PANEL
BUN: 4 mg/dL — ABNORMAL LOW (ref 6–23)
CO2: 20 mEq/L (ref 19–32)
Calcium: 10.2 mg/dL (ref 8.4–10.5)
Chloride: 95 mEq/L — ABNORMAL LOW (ref 96–112)
Creatinine, Ser: 0.86 mg/dL (ref 0.50–1.35)
Potassium: 2.9 mEq/L — ABNORMAL LOW (ref 3.5–5.1)
Sodium: 136 mEq/L (ref 135–145)

## 2012-11-26 LAB — RAPID URINE DRUG SCREEN, HOSP PERFORMED
Benzodiazepines: NOT DETECTED
Cocaine: POSITIVE — AB
Opiates: NOT DETECTED
Tetrahydrocannabinol: POSITIVE — AB

## 2012-11-26 LAB — APTT: aPTT: 27 seconds (ref 24–37)

## 2012-11-26 LAB — TROPONIN I: Troponin I: <0.3 ng/mL (ref <0.30)

## 2012-11-26 MED ORDER — LORAZEPAM 2 MG/ML IJ SOLN
1.0000 mg | Freq: Once | INTRAMUSCULAR | Status: AC
  Administered 2012-11-26: 1 mg via INTRAVENOUS
  Filled 2012-11-26: qty 1

## 2012-11-26 MED ORDER — POTASSIUM CHLORIDE CRYS ER 20 MEQ PO TBCR
40.0000 meq | EXTENDED_RELEASE_TABLET | Freq: Once | ORAL | Status: AC
  Administered 2012-11-26: 40 meq via ORAL
  Filled 2012-11-26: qty 2

## 2012-11-26 MED ORDER — SODIUM CHLORIDE 0.9 % IV SOLN: INTRAVENOUS | Status: DC

## 2012-11-26 MED ORDER — ASPIRIN 81 MG PO CHEW
324.0000 mg | CHEWABLE_TABLET | Freq: Once | ORAL | Status: AC
  Administered 2012-11-26: 324 mg via ORAL
  Filled 2012-11-26: qty 4

## 2012-11-26 MED ORDER — SODIUM CHLORIDE 0.9 % IV BOLUS (SEPSIS)
1000.0000 mL | Freq: Once | INTRAVENOUS | Status: AC
  Administered 2012-11-26: 1000 mL via INTRAVENOUS

## 2012-11-26 NOTE — ED Notes (Signed)
Patient stated that his arms and legs numb as well as his jaw and left side of neck

## 2012-11-26 NOTE — ED Provider Notes (Signed)
  Medical screening examination/treatment/procedure(s) were performed by non-physician practitioner and as supervising physician I was immediately available for consultation/collaboration.     Janely Gullickson, MD 11/26/12 2318 

## 2012-11-26 NOTE — ED Notes (Signed)
Patient complaining of chest tightening

## 2012-11-26 NOTE — ED Notes (Signed)
Pt found outside refusing to come back in for treatment.  Explained to pt the severity of his illness however pt refused to come back inside as has chose to leave AMA.
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
## 2012-11-28 ENCOUNTER — Encounter (HOSPITAL_COMMUNITY): Payer: Self-pay | Admitting: Emergency Medicine

## 2012-11-28 ENCOUNTER — Emergency Department (HOSPITAL_COMMUNITY): Payer: Medicaid Other

## 2012-11-28 ENCOUNTER — Inpatient Hospital Stay (HOSPITAL_COMMUNITY)
Admission: EM | Admit: 2012-11-28 | Discharge: 2012-11-29 | DRG: 842 | Disposition: A | Discharge status: Home / Self Care | Payer: Medicaid Other | Attending: Emergency Medicine | Admitting: Emergency Medicine

## 2012-11-28 DIAGNOSIS — F1010 Alcohol abuse, uncomplicated: Secondary | ICD-10-CM | POA: Diagnosis present

## 2012-11-28 DIAGNOSIS — R0602 Shortness of breath: Secondary | ICD-10-CM

## 2012-11-28 DIAGNOSIS — F1210 Cannabis abuse, uncomplicated: Secondary | ICD-10-CM | POA: Diagnosis present

## 2012-11-28 DIAGNOSIS — Z7982 Long term (current) use of aspirin: Secondary | ICD-10-CM

## 2012-11-28 DIAGNOSIS — F419 Anxiety disorder, unspecified: Secondary | ICD-10-CM | POA: Diagnosis present

## 2012-11-28 DIAGNOSIS — F1410 Cocaine abuse, uncomplicated: Secondary | ICD-10-CM | POA: Diagnosis present

## 2012-11-28 DIAGNOSIS — D45 Polycythemia vera: Principal | ICD-10-CM | POA: Diagnosis present

## 2012-11-28 DIAGNOSIS — F411 Generalized anxiety disorder: Secondary | ICD-10-CM | POA: Diagnosis present

## 2012-11-28 DIAGNOSIS — M79609 Pain in unspecified limb: Secondary | ICD-10-CM

## 2012-11-28 DIAGNOSIS — Z720 Tobacco use: Secondary | ICD-10-CM | POA: Diagnosis present

## 2012-11-28 DIAGNOSIS — F17200 Nicotine dependence, unspecified, uncomplicated: Secondary | ICD-10-CM | POA: Diagnosis present

## 2012-11-28 DIAGNOSIS — F1910 Other psychoactive substance abuse, uncomplicated: Secondary | ICD-10-CM

## 2012-11-28 HISTORY — DX: Tobacco use: Z72.0

## 2012-11-28 HISTORY — DX: Other psychoactive substance abuse, uncomplicated: F19.10

## 2012-11-28 HISTORY — DX: Disease of blood and blood-forming organs, unspecified: D75.9

## 2012-11-28 LAB — CBC
HCT: 52.8 % — ABNORMAL HIGH (ref 39.0–52.0)
Hemoglobin: 19 g/dL — ABNORMAL HIGH (ref 13.0–17.0)
MCHC: 36 g/dL (ref 30.0–36.0)
MCV: 95.1 fL (ref 78.0–100.0)
RBC: 5.55 MIL/uL (ref 4.22–5.81)
WBC: 6.4 10*3/uL (ref 4.0–10.5)

## 2012-11-28 LAB — POCT I-STAT, CHEM 8
BUN: 6 mg/dL (ref 6–23)
Calcium, Ion: 1.09 mmol/L — ABNORMAL LOW (ref 1.12–1.23)
Chloride: 100 mEq/L (ref 96–112)
HCT: 63 % — ABNORMAL HIGH (ref 39.0–52.0)
Potassium: 4 mEq/L (ref 3.5–5.1)

## 2012-11-28 LAB — HEPATIC FUNCTION PANEL
ALT: 29 U/L (ref 0–53)
Albumin: 3.5 g/dL (ref 3.5–5.2)
Alkaline Phosphatase: 94 U/L (ref 39–117)
Bilirubin, Direct: 0.3 mg/dL (ref 0.0–0.3)
Total Bilirubin: 1.2 mg/dL (ref 0.3–1.2)
Total Protein: 7.3 g/dL (ref 6.0–8.3)

## 2012-11-28 LAB — CBC WITH DIFFERENTIAL/PLATELET
Basophils Absolute: 0 10*3/uL (ref 0.0–0.1)
Eosinophils Absolute: 0.2 10*3/uL (ref 0.0–0.7)
HCT: 54.8 % — ABNORMAL HIGH (ref 39.0–52.0)
Hemoglobin: 20 g/dL — ABNORMAL HIGH (ref 13.0–17.0)
Lymphocytes Relative: 19 % (ref 12–46)
Monocytes Absolute: 1 10*3/uL (ref 0.1–1.0)
Neutro Abs: 4.8 10*3/uL (ref 1.7–7.7)
RBC: 5.78 MIL/uL (ref 4.22–5.81)
RDW: 14.1 % (ref 11.5–15.5)
WBC: 7.4 10*3/uL (ref 4.0–10.5)

## 2012-11-28 LAB — TROPONIN I
Troponin I: <0.3 ng/mL (ref <0.30)
Troponin I: <0.3 ng/mL (ref <0.30)

## 2012-11-28 LAB — RAPID URINE DRUG SCREEN, HOSP PERFORMED
Amphetamines: POSITIVE — AB
Barbiturates: NOT DETECTED
Benzodiazepines: NOT DETECTED
Cocaine: POSITIVE — AB
Opiates: NOT DETECTED
Tetrahydrocannabinol: POSITIVE — AB

## 2012-11-28 LAB — BASIC METABOLIC PANEL
Calcium: 9 mg/dL (ref 8.4–10.5)
Chloride: 99 mEq/L (ref 96–112)
GFR calc Af Amer: >90 mL/min (ref >90)
GFR calc non Af Amer: >90 mL/min (ref >90)
Potassium: 3.8 mEq/L (ref 3.5–5.1)

## 2012-11-28 LAB — PROTIME-INR
INR: 0.95 (ref 0.00–1.49)
Prothrombin Time: 12.5 seconds (ref 11.6–15.2)

## 2012-11-28 LAB — GLUCOSE, CAPILLARY
Glucose-Capillary: 68 mg/dL — ABNORMAL LOW (ref 70–99)
Glucose-Capillary: 110 mg/dL — ABNORMAL HIGH (ref 70–99)
Glucose-Capillary: 81 mg/dL (ref 70–99)

## 2012-11-28 LAB — MAGNESIUM: Magnesium: 2 mg/dL (ref 1.5–2.5)

## 2012-11-28 MED ORDER — ASPIRIN EC 81 MG PO TBEC
81.0000 mg | DELAYED_RELEASE_TABLET | Freq: Every day | ORAL | Status: DC
  Administered 2012-11-28 – 2012-11-29 (×2): 81 mg via ORAL
  Filled 2012-11-28 (×2): qty 1

## 2012-11-28 MED ORDER — LORAZEPAM 2 MG/ML IJ SOLN
1.0000 mg | Freq: Four times a day (QID) | INTRAMUSCULAR | Status: DC | PRN
  Administered 2012-11-28: 1 mg via INTRAVENOUS
  Filled 2012-11-28: qty 1

## 2012-11-28 MED ORDER — LORAZEPAM 2 MG/ML IJ SOLN: 1.0000 mg | Freq: Four times a day (QID) | INTRAMUSCULAR | Status: DC | PRN

## 2012-11-28 MED ORDER — LORAZEPAM 1 MG PO TABS
1.0000 mg | ORAL_TABLET | Freq: Four times a day (QID) | ORAL | Status: DC | PRN
  Administered 2012-11-28 – 2012-11-29 (×5): 1 mg via ORAL
  Filled 2012-11-28 (×5): qty 1

## 2012-11-28 MED ORDER — SODIUM CHLORIDE 0.9 % IJ SOLN: 3.0000 mL | Freq: Two times a day (BID) | INTRAMUSCULAR | Status: DC

## 2012-11-28 MED ORDER — VITAMIN B-1 100 MG PO TABS
100.0000 mg | ORAL_TABLET | Freq: Every day | ORAL | Status: DC
  Administered 2012-11-29: 100 mg via ORAL
  Filled 2012-11-28: qty 1

## 2012-11-28 MED ORDER — SODIUM CHLORIDE 0.9 % IV SOLN: 250.0000 mL | INTRAVENOUS | Status: DC | PRN

## 2012-11-28 MED ORDER — SODIUM CHLORIDE 0.9 % IV SOLN
INTRAVENOUS | Status: DC
  Administered 2012-11-28 – 2012-11-29 (×2): via INTRAVENOUS
  Administered 2012-11-29: 100 mL/h via INTRAVENOUS

## 2012-11-28 MED ORDER — HEPARIN SODIUM (PORCINE) 5000 UNIT/ML IJ SOLN
5000.0000 [IU] | Freq: Three times a day (TID) | INTRAMUSCULAR | Status: DC
  Administered 2012-11-28 – 2012-11-29 (×2): 5000 [IU] via SUBCUTANEOUS
  Filled 2012-11-28 (×6): qty 1

## 2012-11-28 MED ORDER — SODIUM CHLORIDE 0.9 % IJ SOLN: 3.0000 mL | INTRAMUSCULAR | Status: DC | PRN

## 2012-11-28 MED ORDER — FOLIC ACID 1 MG PO TABS
1.0000 mg | ORAL_TABLET | Freq: Every day | ORAL | Status: DC
  Administered 2012-11-28 – 2012-11-29 (×2): 1 mg via ORAL
  Filled 2012-11-28 (×2): qty 1

## 2012-11-28 MED ORDER — LORAZEPAM 1 MG PO TABS: 1.0000 mg | ORAL_TABLET | Freq: Four times a day (QID) | ORAL | Status: DC | PRN

## 2012-11-28 MED ORDER — ADULT MULTIVITAMIN W/MINERALS CH
1.0000 | ORAL_TABLET | Freq: Every day | ORAL | Status: DC
  Administered 2012-11-28 – 2012-11-29 (×2): 1 via ORAL
  Filled 2012-11-28 (×2): qty 1

## 2012-11-28 MED ORDER — NICOTINE 14 MG/24HR TD PT24
14.0000 mg | MEDICATED_PATCH | Freq: Every day | TRANSDERMAL | Status: DC
  Administered 2012-11-28 – 2012-11-29 (×2): 14 mg via TRANSDERMAL
  Filled 2012-11-28 (×2): qty 1

## 2012-11-28 MED ORDER — THIAMINE HCL 100 MG/ML IJ SOLN
100.0000 mg | Freq: Every day | INTRAMUSCULAR | Status: DC
  Administered 2012-11-28: 100 mg via INTRAVENOUS
  Filled 2012-11-28: qty 2

## 2012-11-28 NOTE — ED Notes (Signed)
Pt reports SOB and states "I have thick blood." Reports vomiting x 3-4 times yesterday but also reports he was drinking. RR WDL, no acute distress noted

## 2012-11-28 NOTE — ED Notes (Signed)
Pt reports he feels very anxious because he has not had anything to drink since about 3am. Hx of alcohol abuse and sts  "I never stop drinking."

## 2012-11-28 NOTE — Progress Notes (Signed)
11/28/2012 5:32 PM Nursing note Results of CBC called to pager # [phone] per orders. Dr. Burtis Junes returned call. No new orders received at this time. Will continue to closely monitor patient.  Jeffery Ramos, Blanchard Kelch
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
## 2012-11-28 NOTE — ED Notes (Signed)
Palmer PA at bedside to see patient.

## 2012-11-28 NOTE — Progress Notes (Signed)
Per MD order, 500 ml of blood pulled from a new started PIV (18g). Pt tolerated procedure well. RN made aware that 500 ml of blood has been pulled off. Consuello Masse
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
## 2012-11-28 NOTE — ED Notes (Signed)
Pt being transported to floor via Malachi Bonds, EMT

## 2012-11-28 NOTE — Progress Notes (Signed)
Case Manager met patient at Bedside.Case manager role explained.Patient verbalizes understanding.Patient confirms he does not have a PCP or Health Insurance.This Associate Professor with patient consent she can e-mail the clinic and assist with the appointment set up.Patient provided his consent for this.Patient provided with a resource sheet for the  [facility] and reports he will call the clinic and follow up.Patient provided with verbal education on the Generic medication four dollar plan at [facility] and [employer] provided for the partnership for prescription assistance,and the [address].Education provided on the importance of establishing a PCP.Patient reports he has substance Abuse Issues and wants to feel better.This Clinical research associate provided patient with education that once he is admitted Case management and Social work can provide further assistance.Patient  Receptive to verbal/ written resources .Teach back method used to ensure patients understanding.

## 2012-11-28 NOTE — Progress Notes (Signed)
VASCULAR LAB PRELIMINARY  PRELIMINARY  PRELIMINARY  PRELIMINARY  Bilateral lower extremity venous duplex  completed.    Preliminary report:  Bilateral:  No evidence of DVT, superficial thrombosis, or Baker's Cyst.    Red Mandt, RVT 11/28/2012, 3:37 PM

## 2012-11-28 NOTE — Progress Notes (Signed)
11/28/2012 6:43 PM Nursing note Received call from CCMD that pt. Had 6 beats of VTach. Upon assessment, pt. States he had just awoke from a dream and did feel his heart "pounding" for a few seconds. States he feels ok at this time. Dr. Burtis Junes paged and made aware. No new orders received at this time. Will continue to monitor patient and rhythm.  Jeffery Ramos, Blanchard Kelch

## 2012-11-28 NOTE — ED Notes (Signed)
Old and New EKG given to Dr Opitz 

## 2012-11-29 ENCOUNTER — Other Ambulatory Visit: Payer: Self-pay | Admitting: Oncology

## 2012-11-29 ENCOUNTER — Telehealth: Payer: Self-pay | Admitting: Hematology & Oncology

## 2012-11-29 DIAGNOSIS — D45 Polycythemia vera: Secondary | ICD-10-CM

## 2012-11-29 LAB — BASIC METABOLIC PANEL
CO2: 24 mEq/L (ref 19–32)
Chloride: 104 mEq/L (ref 96–112)
GFR calc Af Amer: >90 mL/min (ref >90)
Glucose, Bld: 76 mg/dL (ref 70–99)
Potassium: 3.8 mEq/L (ref 3.5–5.1)
Sodium: 137 mEq/L (ref 135–145)

## 2012-11-29 LAB — CBC
HCT: 51.3 % (ref 39.0–52.0)
HCT: 48.4 % (ref 39.0–52.0)
Hemoglobin: 18.5 g/dL — ABNORMAL HIGH (ref 13.0–17.0)
Hemoglobin: 17.9 g/dL — ABNORMAL HIGH (ref 13.0–17.0)
Hemoglobin: 17.3 g/dL — ABNORMAL HIGH (ref 13.0–17.0)
MCH: 35 pg — ABNORMAL HIGH (ref 26.0–34.0)
MCH: 33.6 pg (ref 26.0–34.0)
MCH: 34.3 pg — ABNORMAL HIGH (ref 26.0–34.0)
MCHC: 35.7 g/dL (ref 30.0–36.0)
MCV: 97 fL (ref 78.0–100.0)
MCV: 95.5 fL (ref 78.0–100.0)
RBC: 5.29 MIL/uL (ref 4.22–5.81)
RBC: 5.32 MIL/uL (ref 4.22–5.81)
RBC: 5.05 MIL/uL (ref 4.22–5.81)
WBC: 6.9 10*3/uL (ref 4.0–10.5)
WBC: 6 10*3/uL (ref 4.0–10.5)

## 2012-11-29 LAB — GLUCOSE, CAPILLARY
Glucose-Capillary: 80 mg/dL (ref 70–99)
Glucose-Capillary: 92 mg/dL (ref 70–99)

## 2012-11-29 MED ORDER — THIAMINE HCL 100 MG PO TABS: 100.0000 mg | ORAL_TABLET | Freq: Every day | ORAL | Status: DC

## 2012-11-29 MED ORDER — FOLIC ACID 1 MG PO TABS: 1.0000 mg | ORAL_TABLET | Freq: Every day | ORAL | Status: DC

## 2012-11-29 NOTE — H&P (Signed)
  Date: 11/29/2012  Patient name: Jeffery Ramos  Medical record number: [id number]  Date of birth: 03/09/1980   I have seen and evaluated Jeffery Ramos and discussed their care with the Residency Team.  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  He has not regularly followed up with Cardiology.  He notes that he has started to smoke.  He has also been noted to have heavy ETOH use (12 drinks a day) and other drug use.  On admission his H/H were 21/63.   Assessment and Plan: I have seen and evaluated the patient as outlined above. I agree with the formulated Assessment and Plan as detailed in the residents' admission note, with the following changes:   1. PCV with hyperviscosity symptoms - Hematology consult, discussed with Jeffery Ramos - Telemetry and close monitoring given risk of stroke/MI - LE doppler due to concern for clotting and Geneva score 4, if negative and improves with treatment for hyperviscosity, will not pursue CT chest - ASA - Phlebotomy planned as per Jeffery Ramos note   2. ETOH abuse - CIWA protocol - He is interested in detox, SW consult  Other issues per resident note.  - CIWA protocol  Jeffery Catalina, MD 11/18/20141:23 PM

## 2012-11-29 NOTE — Care Management Note (Signed)
    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  PTA, PT INDEPENDENT OF ADLS.     Action/Plan:   PT HAS NO PCP; APPT MADE AT [facility] FOR [facility], NOV 21 AT 2PM.  STRESSED IMPORTANCE OF KEEPING APPT.   Anticipated DC Date:  11/29/2012   Anticipated DC Plan:  HOME/SELF CARE      DC Planning Services  CM consult  PCP issues  [facility]      Choice offered to / List presented to:             Status of service:  Completed, signed off Medicare Important Message given?   (If response is "NO", the following Medicare IM given date fields will be blank) Date Medicare IM given:   Date Additional Medicare IM given:    Discharge Disposition:  HOME/SELF CARE  Per UR Regulation:  Reviewed for med. necessity/level of care/duration of stay  If discussed at Long Length of Stay Meetings, dates discussed:    Comments:

## 2012-11-29 NOTE — Progress Notes (Signed)
CSW received referral from RN asking to give patient resources for outpatient alcohol use. CSW went into room and introduced self and explained reason for visit. Patient was receptive to CSW visit. Pt reports that he drinks everyday, it has become a habit and drinks to cope with his depression. CSW offered support at this time and asked patient if he was interested in outpatient resources. Patient stated yes and he is going to call and make an appointment tomorrow. CSW provided patient with multiple alcohol resources. Patient thanked CSW for the visit. CSW signing off at this time.   Maree Krabbe, MSW, Theresia Majors [phone]

## 2012-11-29 NOTE — ED Provider Notes (Signed)
Medical screening examination/treatment/procedure(s) were performed by non-physician practitioner and as supervising physician I was immediately available for consultation/collaboration.  EKG Interpretation    Date/Time:    Ventricular Rate:  87 PR Interval:  121 QRS Duration: 99 QT Interval:  377 QTC Calculation: 453 R Axis:   90 Text Interpretation:  Sinus rhythm Nonspecific ST abnormality Abnormal ECG No significant change since last tracing             Sunnie Nielsen, MD 11/29/12 0126
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
## 2012-11-29 NOTE — Progress Notes (Signed)
Please note that the date on my consult note for Jeffery Ramos's follow up appointment with Dr Myna Hidalgo is incorrect. The correct date is not DEC 5 but DEC 30 and the patient should arrive at 11:30 AM.  Please let me know if I can be of further help.

## 2012-11-29 NOTE — Progress Notes (Signed)
Pt discharged per MD order and protocol. Discharge instructions reviewed with patient and all questions answered. Pt aware of all follow up appointments. Pt given prescriptions.

## 2012-11-29 NOTE — Telephone Encounter (Signed)
Per 11/18 pof from [facility] appt w/PE in [place] 12/5 @ 3pm. Pt already schedule in [place] office for 12/30. appt scheduled today by [place] office.

## 2012-12-01 ENCOUNTER — Encounter: Payer: Self-pay | Admitting: Hematology & Oncology

## 2012-12-01 ENCOUNTER — Telehealth: Payer: Self-pay | Admitting: Hematology & Oncology

## 2012-12-01 NOTE — Telephone Encounter (Signed)
Left message on cell phone with 12-30 appointment. Home phone and contact phone are not working. Sent letter with 12-30 appointment

## 2012-12-01 NOTE — Telephone Encounter (Signed)
Received faxed order from Dr. Darnelle Catalan for Dr. Myna Hidalgo to see pt in 3 weeks. Per Dr. Myna Hidalgo to see in 4-6 weeks. I left message on pt's cell to call for appointment, his other lines are not working. I also sent letter with 01-10-13 1130 am appointment and to please call with number that he can be reached at. Letter is scanned in chart along with order from Dr. Darnelle Catalan.

## 2012-12-02 ENCOUNTER — Inpatient Hospital Stay: Payer: Self-pay | Admitting: Internal Medicine
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
## 2012-12-07 ENCOUNTER — Ambulatory Visit: Payer: Self-pay | Admitting: Internal Medicine

## 2013-01-10 ENCOUNTER — Encounter: Payer: Self-pay | Admitting: Hematology & Oncology

## 2013-01-10 ENCOUNTER — Other Ambulatory Visit: Payer: Self-pay | Admitting: Lab

## 2013-01-10 NOTE — Progress Notes (Signed)
Patient failed to keep appointment for the 2nd time.   We will attempt to call him and see about re-scheduling.  Cindee Lame E This encounter was created in error - please disregard.

## 2013-03-04 ENCOUNTER — Emergency Department (HOSPITAL_COMMUNITY): Payer: Medicaid Other

## 2013-03-04 ENCOUNTER — Emergency Department (HOSPITAL_COMMUNITY)
Admission: EM | Admit: 2013-03-04 | Discharge: 2013-03-04 | Disposition: A | Discharge status: Home / Self Care | Payer: Medicaid Other | Attending: Emergency Medicine | Admitting: Emergency Medicine

## 2013-03-04 ENCOUNTER — Encounter (HOSPITAL_COMMUNITY): Payer: Self-pay | Admitting: Emergency Medicine

## 2013-03-04 DIAGNOSIS — Y929 Unspecified place or not applicable: Secondary | ICD-10-CM | POA: Insufficient documentation

## 2013-03-04 DIAGNOSIS — S62329A Displaced fracture of shaft of unspecified metacarpal bone, initial encounter for closed fracture: Secondary | ICD-10-CM | POA: Insufficient documentation

## 2013-03-04 DIAGNOSIS — F1210 Cannabis abuse, uncomplicated: Secondary | ICD-10-CM | POA: Insufficient documentation

## 2013-03-04 DIAGNOSIS — Z7982 Long term (current) use of aspirin: Secondary | ICD-10-CM | POA: Insufficient documentation

## 2013-03-04 DIAGNOSIS — S62319A Displaced fracture of base of unspecified metacarpal bone, initial encounter for closed fracture: Secondary | ICD-10-CM | POA: Insufficient documentation

## 2013-03-04 DIAGNOSIS — Z79899 Other long term (current) drug therapy: Secondary | ICD-10-CM | POA: Insufficient documentation

## 2013-03-04 DIAGNOSIS — Y9389 Activity, other specified: Secondary | ICD-10-CM | POA: Insufficient documentation

## 2013-03-04 DIAGNOSIS — S62339A Displaced fracture of neck of unspecified metacarpal bone, initial encounter for closed fracture: Secondary | ICD-10-CM

## 2013-03-04 DIAGNOSIS — IMO0002 Reserved for concepts with insufficient information to code with codable children: Secondary | ICD-10-CM | POA: Insufficient documentation

## 2013-03-04 DIAGNOSIS — F17200 Nicotine dependence, unspecified, uncomplicated: Secondary | ICD-10-CM | POA: Insufficient documentation

## 2013-03-04 DIAGNOSIS — Z862 Personal history of diseases of the blood and blood-forming organs and certain disorders involving the immune mechanism: Secondary | ICD-10-CM | POA: Insufficient documentation

## 2013-03-04 DIAGNOSIS — F1410 Cocaine abuse, uncomplicated: Secondary | ICD-10-CM | POA: Insufficient documentation

## 2013-03-04 DIAGNOSIS — Z87828 Personal history of other (healed) physical injury and trauma: Secondary | ICD-10-CM | POA: Insufficient documentation

## 2013-03-04 MED ORDER — AMOXICILLIN-POT CLAVULANATE 875-125 MG PO TABS
1.0000 | ORAL_TABLET | Freq: Two times a day (BID) | ORAL | Status: DC
Start: 2013-03-04 — End: 2014-06-18

## 2013-03-04 MED ORDER — CEFAZOLIN SODIUM 1-5 GM-% IV SOLN
1.0000 g | Freq: Once | INTRAVENOUS | Status: AC
  Administered 2013-03-04: 1 g via INTRAVENOUS
  Filled 2013-03-04: qty 50

## 2013-03-04 MED ORDER — HYDROCODONE-ACETAMINOPHEN 5-325 MG PO TABS: 1.0000 | ORAL_TABLET | Freq: Four times a day (QID) | ORAL | Status: DC | PRN

## 2013-03-04 MED ORDER — MORPHINE SULFATE 4 MG/ML IJ SOLN
4.0000 mg | Freq: Once | INTRAMUSCULAR | Status: AC
  Administered 2013-03-04: 4 mg via INTRAVENOUS
  Filled 2013-03-04: qty 1
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
## 2013-03-04 NOTE — Discharge Instructions (Signed)
Return here as needed. Call Dr. Caralyn Guile for an appointment on Tuesday.
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
## 2013-03-05 NOTE — ED Provider Notes (Signed)
History/physical exam/procedure(s) were performed by non-physician practitioner and as supervising physician I was immediately available for consultation/collaboration. I have reviewed all notes and am in agreement with care and plan.   Shaune Pollack, MD 03/05/13 [phone]

## 2013-03-14 NOTE — Progress Notes (Signed)
Home phone listed giving a fast busy signal. Called the cell number on file and left message for patient to call [phone] to [facility] instructions.

## 2013-03-14 NOTE — Progress Notes (Signed)
Left message for patient to arrive @ 12noon.   We have tried contacting patient 3 times.   DA

## 2013-03-15 ENCOUNTER — Encounter (HOSPITAL_COMMUNITY): Payer: Self-pay | Admitting: Certified Registered"

## 2013-03-15 ENCOUNTER — Encounter (HOSPITAL_COMMUNITY): Payer: Medicaid Other | Admitting: Anesthesiology

## 2013-03-15 ENCOUNTER — Ambulatory Visit (HOSPITAL_COMMUNITY): Payer: Medicaid Other | Admitting: Anesthesiology

## 2013-03-15 ENCOUNTER — Ambulatory Visit (HOSPITAL_COMMUNITY)
Admission: RE | Admit: 2013-03-15 | Discharge: 2013-03-15 | Disposition: A | Discharge status: Home / Self Care | Payer: Medicaid Other | Source: Ambulatory Visit | Attending: Orthopedic Surgery | Admitting: Orthopedic Surgery

## 2013-03-15 ENCOUNTER — Encounter (HOSPITAL_COMMUNITY)
Admission: RE | Disposition: A | Discharge status: Home / Self Care | Payer: Self-pay | Source: Ambulatory Visit | Attending: Orthopedic Surgery

## 2013-03-15 DIAGNOSIS — S62609B Fracture of unspecified phalanx of unspecified finger, initial encounter for open fracture: Secondary | ICD-10-CM | POA: Insufficient documentation

## 2013-03-15 DIAGNOSIS — X58XXXA Exposure to other specified factors, initial encounter: Secondary | ICD-10-CM | POA: Insufficient documentation

## 2013-03-15 DIAGNOSIS — S62329B Displaced fracture of shaft of unspecified metacarpal bone, initial encounter for open fracture: Secondary | ICD-10-CM | POA: Insufficient documentation

## 2013-03-15 DIAGNOSIS — F17200 Nicotine dependence, unspecified, uncomplicated: Secondary | ICD-10-CM | POA: Insufficient documentation

## 2013-03-15 DIAGNOSIS — S62329A Displaced fracture of shaft of unspecified metacarpal bone, initial encounter for closed fracture: Secondary | ICD-10-CM | POA: Insufficient documentation

## 2013-03-15 HISTORY — PX: OPEN REDUCTION INTERNAL FIXATION (ORIF) METACARPAL: SHX6234

## 2013-03-15 LAB — CBC
HCT: 55.7 % — ABNORMAL HIGH (ref 39.0–52.0)
Hemoglobin: 20.6 g/dL — ABNORMAL HIGH (ref 13.0–17.0)
MCH: 33.3 pg (ref 26.0–34.0)
MCHC: 37 g/dL — ABNORMAL HIGH (ref 30.0–36.0)
MCV: 90.1 fL (ref 78.0–100.0)
PLATELETS: 163 10*3/uL (ref 150–400)
RBC: 6.18 MIL/uL — ABNORMAL HIGH (ref 4.22–5.81)
RDW: 15.1 % (ref 11.5–15.5)
WBC: 6.5 10*3/uL (ref 4.0–10.5)

## 2013-03-15 SURGERY — OPEN REDUCTION INTERNAL FIXATION (ORIF) METACARPAL
Anesthesia: General | Site: Finger | Laterality: Right

## 2013-03-15 MED ORDER — ONDANSETRON HCL 4 MG/2ML IJ SOLN
INTRAMUSCULAR | Status: AC
  Filled 2013-03-15: qty 2

## 2013-03-15 MED ORDER — DEXAMETHASONE SODIUM PHOSPHATE 10 MG/ML IJ SOLN
INTRAMUSCULAR | Status: DC | PRN
  Administered 2013-03-15: 8 mg

## 2013-03-15 MED ORDER — MIDAZOLAM HCL 2 MG/2ML IJ SOLN
INTRAMUSCULAR | Status: AC
  Administered 2013-03-15: 2 mg
  Filled 2013-03-15: qty 2

## 2013-03-15 MED ORDER — LACTATED RINGERS IV SOLN
INTRAVENOUS | Status: DC
  Administered 2013-03-15 (×2): via INTRAVENOUS

## 2013-03-15 MED ORDER — OXYCODONE-ACETAMINOPHEN 10-325 MG PO TABS: 1.0000 | ORAL_TABLET | ORAL | Status: DC | PRN

## 2013-03-15 MED ORDER — OXYCODONE HCL 5 MG PO TABS: 5.0000 mg | ORAL_TABLET | Freq: Once | ORAL | Status: DC | PRN

## 2013-03-15 MED ORDER — ARTIFICIAL TEARS OP OINT
TOPICAL_OINTMENT | OPHTHALMIC | Status: DC | PRN
  Administered 2013-03-15: 1 via OPHTHALMIC

## 2013-03-15 MED ORDER — MIDAZOLAM HCL 2 MG/2ML IJ SOLN
INTRAMUSCULAR | Status: AC
  Filled 2013-03-15: qty 2

## 2013-03-15 MED ORDER — ARTIFICIAL TEARS OP OINT
TOPICAL_OINTMENT | OPHTHALMIC | Status: AC
  Filled 2013-03-15: qty 3.5

## 2013-03-15 MED ORDER — PROPOFOL 10 MG/ML IV BOLUS
INTRAVENOUS | Status: DC | PRN
  Administered 2013-03-15: 200 mg via INTRAVENOUS

## 2013-03-15 MED ORDER — FENTANYL CITRATE 0.05 MG/ML IJ SOLN
INTRAMUSCULAR | Status: AC
  Administered 2013-03-15: 100 ug
  Filled 2013-03-15: qty 2

## 2013-03-15 MED ORDER — HYDROMORPHONE HCL PF 1 MG/ML IJ SOLN: 0.2500 mg | INTRAMUSCULAR | Status: DC | PRN

## 2013-03-15 MED ORDER — PROPOFOL 10 MG/ML IV BOLUS
INTRAVENOUS | Status: AC
  Filled 2013-03-15: qty 20

## 2013-03-15 MED ORDER — 0.9 % SODIUM CHLORIDE (POUR BTL) OPTIME
TOPICAL | Status: DC | PRN
  Administered 2013-03-15: 1000 mL

## 2013-03-15 MED ORDER — BUPIVACAINE HCL (PF) 0.25 % IJ SOLN
INTRAMUSCULAR | Status: AC
  Filled 2013-03-15: qty 30

## 2013-03-15 MED ORDER — PROMETHAZINE HCL 25 MG/ML IJ SOLN: 6.2500 mg | INTRAMUSCULAR | Status: DC | PRN

## 2013-03-15 MED ORDER — BUPIVACAINE HCL (PF) 0.25 % IJ SOLN
INTRAMUSCULAR | Status: DC | PRN
  Administered 2013-03-15: 10 mL

## 2013-03-15 MED ORDER — ONDANSETRON HCL 4 MG/2ML IJ SOLN
INTRAMUSCULAR | Status: DC | PRN
  Administered 2013-03-15: 4 mg via INTRAVENOUS

## 2013-03-15 MED ORDER — CEFAZOLIN SODIUM-DEXTROSE 2-3 GM-% IV SOLR
2.0000 g | INTRAVENOUS | Status: AC
  Administered 2013-03-15: 2 g via INTRAVENOUS

## 2013-03-15 MED ORDER — OXYCODONE-ACETAMINOPHEN 5-325 MG PO TABS
ORAL_TABLET | ORAL | Status: AC
  Administered 2013-03-15: 2
  Filled 2013-03-15: qty 2

## 2013-03-15 MED ORDER — FENTANYL CITRATE 0.05 MG/ML IJ SOLN
INTRAMUSCULAR | Status: AC
  Filled 2013-03-15: qty 5

## 2013-03-15 MED ORDER — OXYCODONE HCL 5 MG/5ML PO SOLN: 5.0000 mg | Freq: Once | ORAL | Status: DC | PRN

## 2013-03-15 MED ORDER — DOCUSATE SODIUM 100 MG PO CAPS: 100.0000 mg | ORAL_CAPSULE | Freq: Two times a day (BID) | ORAL | Status: DC

## 2013-03-15 MED ORDER — CHLORHEXIDINE GLUCONATE 4 % EX LIQD
60.0000 mL | Freq: Once | CUTANEOUS | Status: DC
  Filled 2013-03-15: qty 60

## 2013-03-15 MED ORDER — OXYCODONE-ACETAMINOPHEN 5-325 MG PO TABS: 2.0000 | ORAL_TABLET | Freq: Once | ORAL | Status: DC

## 2013-03-15 MED ORDER — CEFAZOLIN SODIUM-DEXTROSE 2-3 GM-% IV SOLR
INTRAVENOUS | Status: AC
  Filled 2013-03-15: qty 50

## 2013-03-15 MED ORDER — MIDAZOLAM HCL 5 MG/5ML IJ SOLN
INTRAMUSCULAR | Status: DC | PRN
  Administered 2013-03-15: 2 mg via INTRAVENOUS

## 2013-03-15 MED ORDER — BUPIVACAINE-EPINEPHRINE PF 0.5-1:200000 % IJ SOLN
INTRAMUSCULAR | Status: DC | PRN
  Administered 2013-03-15: 150 mg via PERINEURAL

## 2013-03-15 SURGICAL SUPPLY — 63 items
BANDAGE ELASTIC 3 VELCRO ST LF (GAUZE/BANDAGES/DRESSINGS) ×3 IMPLANT
BANDAGE ELASTIC 4 VELCRO ST LF (GAUZE/BANDAGES/DRESSINGS) ×3 IMPLANT
BANDAGE GAUZE ELAST BULKY 4 IN (GAUZE/BANDAGES/DRESSINGS) ×3 IMPLANT
BIT DRILL 1.1 (BIT) ×1
BIT DRILL 1.1MM (BIT) ×1
BIT DRILL 60X20X1.1XQC TMX (BIT) ×1 IMPLANT
BIT DRL 60X20X1.1XQC TMX (BIT) ×1
BLADE SURG ROTATE 9660 (MISCELLANEOUS) IMPLANT
BNDG ESMARK 4X9 LF (GAUZE/BANDAGES/DRESSINGS) ×3 IMPLANT
CLOSURE WOUND 1/2 X4 (GAUZE/BANDAGES/DRESSINGS)
CORDS BIPOLAR (ELECTRODE) ×3 IMPLANT
COVER SURGICAL LIGHT HANDLE (MISCELLANEOUS) ×3 IMPLANT
CUFF TOURNIQUET SINGLE 18IN (TOURNIQUET CUFF) ×3 IMPLANT
CUFF TOURNIQUET SINGLE 24IN (TOURNIQUET CUFF) IMPLANT
DRAIN TLS ROUND 10FR (DRAIN) IMPLANT
DRAPE OEC MINIVIEW 54X84 (DRAPES) ×3 IMPLANT
DRAPE SURG 17X11 SM STRL (DRAPES) ×3 IMPLANT
DRSG ADAPTIC 3X8 NADH LF (GAUZE/BANDAGES/DRESSINGS) ×3 IMPLANT
GAUZE SPONGE 4X4 16PLY XRAY LF (GAUZE/BANDAGES/DRESSINGS) IMPLANT
GLOVE BIOGEL PI IND STRL 8.5 (GLOVE) ×1 IMPLANT
GLOVE BIOGEL PI INDICATOR 8.5 (GLOVE) ×2
GLOVE SURG ORTHO 8.0 STRL STRW (GLOVE) ×3 IMPLANT
GOWN PREVENTION PLUS XLARGE (GOWN DISPOSABLE) ×3 IMPLANT
GOWN STRL NON-REIN LRG LVL3 (GOWN DISPOSABLE) ×9 IMPLANT
K-WIRE DBL TRONS .035X6 (WIRE) ×3
KIT BASIN OR (CUSTOM PROCEDURE TRAY) ×3 IMPLANT
KIT ROOM TURNOVER OR (KITS) ×3 IMPLANT
KWIRE DBL TRONS .035X6 (WIRE) ×1 IMPLANT
LOCK SCREW 1.5X15MM (Screw) ×3 IMPLANT
MANIFOLD NEPTUNE II (INSTRUMENTS) ×3 IMPLANT
NEEDLE HYPO 25X1 1.5 SAFETY (NEEDLE) ×3 IMPLANT
NS IRRIG 1000ML POUR BTL (IV SOLUTION) ×3 IMPLANT
PACK ORTHO EXTREMITY (CUSTOM PROCEDURE TRAY) ×3 IMPLANT
PAD ARMBOARD 7.5X6 YLW CONV (MISCELLANEOUS) ×6 IMPLANT
PAD CAST 3X4 CTTN HI CHSV (CAST SUPPLIES) ×1 IMPLANT
PAD CAST 4YDX4 CTTN HI CHSV (CAST SUPPLIES) ×1 IMPLANT
PADDING CAST COTTON 3X4 STRL (CAST SUPPLIES) ×2
PADDING CAST COTTON 4X4 STRL (CAST SUPPLIES) ×2
PLATE T SMALL 1.5MM (Plate) ×3 IMPLANT
SCREW L 1.5X12 (Screw) ×3 IMPLANT
SCREW L 1.5X14 (Screw) ×3 IMPLANT
SCREW LOCK 1.5X15MM (Screw) ×1 IMPLANT
SCREW NL 1.5X12 (Screw) ×6 IMPLANT
SCREW NL 1.5X13 (Screw) ×3 IMPLANT
SCREW NONIOC 1.5 14M (Screw) ×3 IMPLANT
SOAP 2 % CHG 4 OZ (WOUND CARE) ×3 IMPLANT
SPLINT FIBERGLASS 3X35 (CAST SUPPLIES) ×3 IMPLANT
SPONGE GAUZE 4X4 12PLY (GAUZE/BANDAGES/DRESSINGS) ×3 IMPLANT
STRIP CLOSURE SKIN 1/2X4 (GAUZE/BANDAGES/DRESSINGS) IMPLANT
SUT ETHILON 4 0 PS 2 18 (SUTURE) ×3 IMPLANT
SUT MNCRL AB 4-0 PS2 18 (SUTURE) IMPLANT
SUT VIC AB 2-0 FS1 27 (SUTURE) ×3 IMPLANT
SUT VIC AB 3-0 FS2 27 (SUTURE) ×3 IMPLANT
SUT VICRYL 4-0 PS2 18IN ABS (SUTURE) ×3 IMPLANT
SUT VICRYL RAPIDE 4/0 PS 2 (SUTURE) ×3 IMPLANT
SYR CONTROL 10ML LL (SYRINGE) ×3 IMPLANT
SYSTEM CHEST DRAIN TLS 7FR (DRAIN) IMPLANT
TOWEL OR 17X24 6PK STRL BLUE (TOWEL DISPOSABLE) ×3 IMPLANT
TOWEL OR 17X26 10 PK STRL BLUE (TOWEL DISPOSABLE) ×3 IMPLANT
TUBE CONNECTING 12'X1/4 (SUCTIONS) ×1
TUBE CONNECTING 12X1/4 (SUCTIONS) ×2 IMPLANT
WATER STERILE IRR 1000ML POUR (IV SOLUTION) IMPLANT
YANKAUER SUCT BULB TIP NO VENT (SUCTIONS) ×3 IMPLANT

## 2013-03-15 NOTE — Preoperative (Signed)
Beta Blockers   Reason not to administer Beta Blockers:Not Applicable 

## 2013-03-15 NOTE — Anesthesia Postprocedure Evaluation (Signed)
Anesthesia Post Note  Patient: Jeffery Ramos  Procedure(s) Performed: Procedure(s) (LRB): OPEN REDUCTION INTERNAL FIXATION (ORIF)  RIGHT SMALL AND RING FINGER (Right)  Anesthesia type: General  Patient location: PACU  Post pain: Pain level controlled and Adequate analgesia  Post assessment: Post-op Vital signs reviewed, Patient's Cardiovascular Status Stable, Respiratory Function Stable, Patent Airway and Pain level controlled  Last Vitals:  Filed Vitals:   03/15/13 1745  BP: 124/81  Pulse: 80  Temp:   Resp: 18    Post vital signs: Reviewed and stable  Level of consciousness: awake, alert  and oriented  Complications: No apparent anesthesia complications

## 2013-03-15 NOTE — Transfer of Care (Signed)
Immediate Anesthesia Transfer of Care Note  Patient: Jeffery Ramos  Procedure(s) Performed: Procedure(s): OPEN REDUCTION INTERNAL FIXATION (ORIF)  RIGHT SMALL AND RING FINGER (Right)  Patient Location: PACU  Anesthesia Type:General  Level of Consciousness: lethargic and responds to stimulation  Airway & Oxygen Therapy: Patient Spontanous Breathing and Patient connected to face mask oxygen  Post-op Assessment: Report given to PACU RN  Post vital signs: Reviewed and stable  Complications: No apparent anesthesia complications

## 2013-03-15 NOTE — Discharge Instructions (Signed)
KEEP BANDAGE CLEAN AND DRY CALL OFFICE FOR F/U APPT [phone] in two weeks  KEEP HAND ELEVATED ABOVE HEART OK TO APPLY ICE TO OPERATIVE AREA CONTACT OFFICE IF ANY WORSENING PAIN OR CONCERNS.

## 2013-03-15 NOTE — Anesthesia Preprocedure Evaluation (Addendum)
Anesthesia Evaluation  Patient identified by MRN, date of birth, ID band Patient awake    Reviewed: Allergy & Precautions, H&P , NPO status , Patient's Chart, lab work & pertinent test results  Airway Mallampati: II TM Distance: >3 FB Neck ROM: Full    Dental  (+) Teeth Intact, Dental Advisory Given   Pulmonary Current Smoker,    Pulmonary exam normal       Cardiovascular negative cardio ROS  Rhythm:Regular Rate:Tachycardia     Neuro/Psych negative neurological ROS  negative psych ROS   GI/Hepatic negative GI ROS, Neg liver ROS, (+)     substance abuse   ,   Endo/Other  negative endocrine ROS  Renal/GU negative Renal ROS     Musculoskeletal   Abdominal   Peds  Hematology  (+) Blood dyscrasia, ,   Anesthesia Other Findings   Reproductive/Obstetrics negative OB ROS                        Anesthesia Physical Anesthesia Plan  ASA: II  Anesthesia Plan: General   Post-op Pain Management:    Induction: Intravenous  Airway Management Planned: LMA  Additional Equipment:   Intra-op Plan:   Post-operative Plan: Extubation in OR  Informed Consent: I have reviewed the patients History and Physical, chart, labs and discussed the procedure including the risks, benefits and alternatives for the proposed anesthesia with the patient or authorized representative who has indicated his/her understanding and acceptance.   Dental advisory given  Plan Discussed with: CRNA, Anesthesiologist and Surgeon  Anesthesia Plan Comments:        Anesthesia Quick Evaluation
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
## 2013-03-15 NOTE — Brief Op Note (Signed)
03/15/2013  12:47 PM  PATIENT:  Jeffery Ramos  33 y.o. male  PRE-OPERATIVE DIAGNOSIS:  RIGHT SMALL AND RING FINGER METACARPAL FRACTURES  POST-OPERATIVE DIAGNOSIS:  * No post-op diagnosis entered *  PROCEDURE:  Procedure(s): OPEN REDUCTION INTERNAL FIXATION (ORIF)  RIGHT SMALL AND RING FINGER (Right)  SURGEON:  Surgeon(s) and Role:    * Linna Hoff, MD - Primary  PHYSICIAN ASSISTANT:   ASSISTANTS: none   ANESTHESIA:   general  EBL:     BLOOD ADMINISTERED:none  DRAINS: none   LOCAL MEDICATIONS USED:  MARCAINE     SPECIMEN:  No Specimen  DISPOSITION OF SPECIMEN:  N/A  COUNTS:  YES  TOURNIQUET:  * No tourniquets in log *  DICTATION: .Other Dictation: Dictation Number [id number]  PLAN OF CARE: Discharge to home after PACU  PATIENT DISPOSITION:  PACU - hemodynamically stable.   Delay start of Pharmacological VTE agent (>24hrs) due to surgical blood loss or risk of bleeding: not applicable

## 2013-03-15 NOTE — Anesthesia Procedure Notes (Addendum)
Anesthesia Regional Block:  Supraclavicular block  Pre-Anesthetic Checklist: ,, timeout performed, Correct Patient, Correct Site, Correct Laterality, Correct Procedure, Correct Position, site marked, Risks and benefits discussed,  Surgical consent,  Pre-op evaluation,  At surgeon's request and post-op pain management  Laterality: Right  Prep: chloraprep       Needles:  Injection technique: Single-shot  Needle Type: Echogenic Stimulator Needle     Needle Length: 5cm 5 cm Needle Gauge: 22 and 22 G    Additional Needles:  Procedures: ultrasound guided (picture in chart) and nerve stimulator Supraclavicular block  Nerve Stimulator or Paresthesia:  Response: bicep contraction, 0.45 mA,   Additional Responses:   Narrative:  Start time: 03/15/2013 2:13 PM End time: 03/15/2013 2:23 PM Injection made incrementally with aspirations every 5 mL.  Performed by: Personally  Anesthesiologist: J. Tamela Gammon, MD  Additional Notes: Functioning IV was confirmed and monitors applied.  A 72mm 22ga echogenic arrow stimulator was used. Sterile prep and drape,hand hygiene and sterile gloves were used.Ultrasound guidance: relevant anatomy identified, needle position confirmed, local anesthetic spread visualized around nerve(s)., vascular puncture avoided.  Image printed for medical record.  Negative aspiration and negative test dose prior to incremental administration of local anesthetic. The patient tolerated the procedure well.   Procedure Name: LMA Insertion Date/Time: 03/15/2013 3:59 PM Performed by: Sampson Si E Pre-anesthesia Checklist: Patient identified, Emergency Drugs available, Suction available, Patient being monitored and Timeout performed Patient Re-evaluated:Patient Re-evaluated prior to inductionOxygen Delivery Method: Circle system utilized Preoxygenation: Pre-oxygenation with 100% oxygen Intubation Type: IV induction LMA: LMA inserted LMA Size: 5.0 Number of attempts:  1 Placement Confirmation: positive ETCO2 and breath sounds checked- equal and bilateral Tube secured with: Tape Dental Injury: Teeth and Oropharynx as per pre-operative assessment

## 2013-03-16 ENCOUNTER — Encounter (HOSPITAL_COMMUNITY): Payer: Self-pay | Admitting: Orthopedic Surgery

## 2013-03-16 NOTE — Op Note (Signed)
Jeffery Ramos, Jeffery NO.:  Ramos  MEDICAL RECORD NO.:  [id number]  LOCATION:  [facility]                         FACILITY:  [facility]  PHYSICIAN:  Melrose Nakayama, MD  DATE OF BIRTH:  Aug 24, 1980  DATE OF PROCEDURE:  03/15/2013 DATE OF DISCHARGE:  03/15/2013                              OPERATIVE REPORT   PREOPERATIVE DIAGNOSES: 1. Left small finger metacarpal shaft fracture, displaced. 2. Left ring finger metacarpal shaft fracture, nondisplaced. 3. Left small finger open wound with underlying open fracture. 4. Grade 1 open metacarpal shaft fracture.  POSTOPERATIVE DIAGNOSES: 1. Left small finger metacarpal shaft fracture, displaced. 2. Left ring finger metacarpal shaft fracture, nondisplaced. 3. Left small finger open wound with underlying open fracture. 4. Grade 1 open metacarpal shaft fracture.  ATTENDING PHYSICIAN:  Melrose Nakayama, MD, who scrubbed and present for the entire procedure.  ASSISTANT SURGEON:  None.  ANESTHESIA:  General via LMA.  SURGICAL PROCEDURES: 1. Debridement of skin, subcutaneous tissue, and bone associated with     open fracture, right small finger, excisional debridement. 2. Right small finger open reduction and internal fixation with plate     and screw construct, metacarpal shaft. 3. Closed treatment of right ring finger metacarpal shaft fracture     without internal fixation. 4. Radiographs 3 views, right hand.  SURGICAL IMPLANTS:  DePuy hand small T-plate with 3 screws distally and 4 screws proximally combination of locking and nonlocking screws, 1.5 mm.  SURGICAL INDICATIONS:  The patient is a right-hand-dominant gentleman, who sustained a closed injury to his right hand.  The patient was seen and evaluated in the office and given the nature of his injury, it was recommended that he undergo the above procedure.  Risks, benefits, and alternatives were discussed in detail with the patient.  Signed informed consent was  obtained.  Risks include, but not limited to bleeding, infection, damage to nearby nerves, arteries, or tendons, loss of motion of wrist and digits, nonunion, malunion, hardware failure, and need for further surgical intervention.  DESCRIPTION OF PROCEDURE:  The patient was properly identified in the preoperative holding area.  A mark with a permanent marker made on the right hand to indicate the correct operative site.  The patient was then brought back to the operating room, placed supine on the anesthesia table.  General anesthesia was administered.  The patient tolerated this well.  A well-padded tourniquet was then placed on the right brachium and sealed with 1000 drape.  The right upper extremity was then prepped and draped in normal sterile fashion.  Time-out was called.  Correct site was identified and procedure was then begun.  Attention was then turned to the right hand where a longitudinal incision made.  The limb was then elevated using Esmarch exsanguination and tourniquet insufflated.  A large incision made between the small finger and the ring finger metacarpal shaft.  Dissection was then carried down through the skin subcutaneous tissue.  The patient did have a grade 1 open fracture.  The bone penetrated through the periosteal fascial layer and periosteum.  Excisional debridement was then carried down with knives, curettes, and rongeurs debriding the open fracture site.  After open debridement and  evacuation, the frayed fracture hematoma and open reduction of the small finger metacarpal shaft was then carried out.  It was held temporarily in place with a reduction clamp.  A T-plate was then applied.  His position confirmed using a mini C-arm.  Held in place with a small K-wires.  Following this, after K-wire fixation, the wound was then thoroughly irrigated.  The fixation was then carried out with a combination of locking and nonlocking screws with the 1.1 mm drill  bit, 1.5 mm screws.  Following this, the final radiographs were then obtained of the hand, which showed good near anatomical alignment in all planes. After internal fixation, the small finger and the ring finger was assessed radiographically and appeared to be in good position without significant shortening or displacement, therefore continuation of the closed treatment was then carried out.  Thorough wound irrigation done throughout.  The fascia layer was then closed with 3-0 Vicryl. Subcutaneous tissue was closed with 4-0 Vicryl.  Skin closed with 4-0 Vicryl Rapide.  10 mL of 0.25% Marcaine metacarpal block was performed. The patient tolerated this well.  A sterile compressive bandage was then applied.  The patient was then placed in a well-padded volar splint, extubated, and taken to the recovery room in good condition.  Postprocedure, intraoperative radiographs 3 views of the hand do show the internal fixation in place.  There was good position in all planes.  POSTPROCEDURE PLAN:  The patient was discharged to home, seen back in the office in approximately 2 weeks for wound check, suture removal, and x-rays and a short-arm cast, immobilizing all the way up past the MP joint and then radiographs at the 4-week mark and then out of the cast and then gradually use and activity.     Melrose Nakayama, MD     FWO/MEDQ  D:  03/15/2013  T:  03/16/2013  Job:  [id number]

## 2013-03-24 NOTE — Addendum Note (Signed)
Addendum created 03/24/13 0740 by Albertha Ghee, MD   Modules edited: Anesthesia Responsible Staff

## 2014-06-16 ENCOUNTER — Observation Stay (HOSPITAL_COMMUNITY)
Admission: EM | Admit: 2014-06-16 | Discharge: 2014-06-18 | Disposition: A | Discharge status: Home / Self Care | Payer: Medicaid Other | Attending: Internal Medicine | Admitting: Internal Medicine

## 2014-06-16 ENCOUNTER — Encounter (HOSPITAL_COMMUNITY): Payer: Self-pay | Admitting: Emergency Medicine

## 2014-06-16 ENCOUNTER — Emergency Department (HOSPITAL_COMMUNITY): Payer: Medicaid Other

## 2014-06-16 DIAGNOSIS — F17210 Nicotine dependence, cigarettes, uncomplicated: Secondary | ICD-10-CM | POA: Diagnosis not present

## 2014-06-16 DIAGNOSIS — F1910 Other psychoactive substance abuse, uncomplicated: Secondary | ICD-10-CM | POA: Diagnosis not present

## 2014-06-16 DIAGNOSIS — R079 Chest pain, unspecified: Secondary | ICD-10-CM | POA: Diagnosis present

## 2014-06-16 DIAGNOSIS — Z7982 Long term (current) use of aspirin: Secondary | ICD-10-CM | POA: Insufficient documentation

## 2014-06-16 DIAGNOSIS — D45 Polycythemia vera: Secondary | ICD-10-CM | POA: Diagnosis not present

## 2014-06-16 DIAGNOSIS — Z91010 Allergy to peanuts: Secondary | ICD-10-CM | POA: Diagnosis not present

## 2014-06-16 DIAGNOSIS — F1010 Alcohol abuse, uncomplicated: Secondary | ICD-10-CM | POA: Diagnosis present

## 2014-06-16 DIAGNOSIS — Z91018 Allergy to other foods: Secondary | ICD-10-CM | POA: Diagnosis not present

## 2014-06-16 DIAGNOSIS — M795 Residual foreign body in soft tissue: Secondary | ICD-10-CM | POA: Diagnosis present

## 2014-06-16 DIAGNOSIS — R0789 Other chest pain: Secondary | ICD-10-CM

## 2014-06-16 DIAGNOSIS — F419 Anxiety disorder, unspecified: Secondary | ICD-10-CM | POA: Diagnosis not present

## 2014-06-16 DIAGNOSIS — IMO0002 Reserved for concepts with insufficient information to code with codable children: Secondary | ICD-10-CM | POA: Diagnosis present

## 2014-06-16 DIAGNOSIS — R42 Dizziness and giddiness: Secondary | ICD-10-CM | POA: Diagnosis present

## 2014-06-16 LAB — CBC
HCT: 59.3 % — ABNORMAL HIGH (ref 39.0–52.0)
Hemoglobin: 21 g/dL — ABNORMAL HIGH (ref 13.0–17.0)
MCH: 32.6 pg (ref 26.0–34.0)
MCHC: 35.4 g/dL (ref 30.0–36.0)
MCV: 92.1 fL (ref 78.0–100.0)
Platelets: 178 10*3/uL (ref 150–400)
RBC: 6.44 MIL/uL — ABNORMAL HIGH (ref 4.22–5.81)
RDW: 14.4 % (ref 11.5–15.5)
WBC: 6.4 10*3/uL (ref 4.0–10.5)

## 2014-06-16 LAB — BASIC METABOLIC PANEL WITH GFR
Anion gap: 11 (ref 5–15)
BUN: <5 mg/dL — ABNORMAL LOW (ref 6–20)
CO2: 24 mmol/L (ref 22–32)
Calcium: 9 mg/dL (ref 8.9–10.3)
Chloride: 99 mmol/L — ABNORMAL LOW (ref 101–111)
Creatinine, Ser: 0.85 mg/dL (ref 0.61–1.24)
GFR calc Af Amer: >60 mL/min
GFR calc non Af Amer: >60 mL/min
Glucose, Bld: 112 mg/dL — ABNORMAL HIGH (ref 65–99)
Potassium: 3.1 mmol/L — ABNORMAL LOW (ref 3.5–5.1)
Sodium: 134 mmol/L — ABNORMAL LOW (ref 135–145)

## 2014-06-16 LAB — I-STAT TROPONIN, ED: Troponin i, poc: 0 ng/mL (ref 0.00–0.08)

## 2014-06-16 MED ORDER — ONDANSETRON HCL 4 MG/2ML IJ SOLN
4.0000 mg | Freq: Once | INTRAMUSCULAR | Status: AC
  Administered 2014-06-16: 4 mg via INTRAVENOUS
  Filled 2014-06-16: qty 2

## 2014-06-16 MED ORDER — ASPIRIN 81 MG PO CHEW
81.0000 mg | CHEWABLE_TABLET | Freq: Once | ORAL | Status: DC
  Filled 2014-06-16: qty 1

## 2014-06-16 MED ORDER — ASPIRIN 81 MG PO CHEW
324.0000 mg | CHEWABLE_TABLET | Freq: Once | ORAL | Status: AC
  Administered 2014-06-16: 324 mg via ORAL

## 2014-06-16 MED ORDER — SODIUM CHLORIDE 0.9 % IV BOLUS (SEPSIS)
1000.0000 mL | Freq: Once | INTRAVENOUS | Status: AC
  Administered 2014-06-17: 1000 mL via INTRAVENOUS
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
## 2014-06-16 NOTE — ED Notes (Signed)
Pt arrived to the ED with a complaint of chest pain.  Pt as a history pf polycythemia.  Pt states pain has been present in the left chest for two days.  Pt states when his condition gets bad he feels this way.  Pt states he has had shortness of breath.  Pt describes pain as annoying.

## 2014-06-17 DIAGNOSIS — D45 Polycythemia vera: Secondary | ICD-10-CM

## 2014-06-17 DIAGNOSIS — R0789 Other chest pain: Secondary | ICD-10-CM | POA: Diagnosis not present

## 2014-06-17 DIAGNOSIS — R079 Chest pain, unspecified: Secondary | ICD-10-CM | POA: Diagnosis present

## 2014-06-17 DIAGNOSIS — F1010 Alcohol abuse, uncomplicated: Secondary | ICD-10-CM

## 2014-06-17 DIAGNOSIS — IMO0002 Reserved for concepts with insufficient information to code with codable children: Secondary | ICD-10-CM | POA: Diagnosis present

## 2014-06-17 DIAGNOSIS — R42 Dizziness and giddiness: Secondary | ICD-10-CM | POA: Diagnosis not present

## 2014-06-17 DIAGNOSIS — F419 Anxiety disorder, unspecified: Secondary | ICD-10-CM | POA: Diagnosis not present

## 2014-06-17 DIAGNOSIS — M795 Residual foreign body in soft tissue: Secondary | ICD-10-CM | POA: Diagnosis present

## 2014-06-17 LAB — CBC
HEMATOCRIT: 57.6 % — AB (ref 39.0–52.0)
HEMOGLOBIN: 20 g/dL — AB (ref 13.0–17.0)
MCH: 32.4 pg (ref 26.0–34.0)
MCHC: 34.7 g/dL (ref 30.0–36.0)
MCV: 93.4 fL (ref 78.0–100.0)
Platelets: 171 10*3/uL (ref 150–400)
RBC: 6.17 MIL/uL — ABNORMAL HIGH (ref 4.22–5.81)
RDW: 14.5 % (ref 11.5–15.5)
WBC: 5.1 10*3/uL (ref 4.0–10.5)

## 2014-06-17 LAB — BASIC METABOLIC PANEL
ANION GAP: 8 (ref 5–15)
BUN: <5 mg/dL — ABNORMAL LOW (ref 6–20)
CHLORIDE: 102 mmol/L (ref 101–111)
CO2: 30 mmol/L (ref 22–32)
Calcium: 8.9 mg/dL (ref 8.9–10.3)
Creatinine, Ser: 1.01 mg/dL (ref 0.61–1.24)
GFR calc Af Amer: >60 mL/min (ref >60)
GFR calc non Af Amer: >60 mL/min (ref >60)
GLUCOSE: 95 mg/dL (ref 65–99)
POTASSIUM: 3.4 mmol/L — AB (ref 3.5–5.1)
Sodium: 140 mmol/L (ref 135–145)

## 2014-06-17 LAB — HEMOGLOBIN AND HEMATOCRIT, BLOOD
HCT: 58.4 % — ABNORMAL HIGH (ref 39.0–52.0)
HEMATOCRIT: 58 % — AB (ref 39.0–52.0)
HEMOGLOBIN: 20.5 g/dL — AB (ref 13.0–17.0)
Hemoglobin: 20 g/dL — ABNORMAL HIGH (ref 13.0–17.0)

## 2014-06-17 MED ORDER — SODIUM CHLORIDE 0.9 % IV SOLN: 250.0000 mL | INTRAVENOUS | Status: DC | PRN

## 2014-06-17 MED ORDER — ADULT MULTIVITAMIN W/MINERALS CH
1.0000 | ORAL_TABLET | Freq: Every day | ORAL | Status: DC
  Administered 2014-06-17 – 2014-06-18 (×2): 1 via ORAL
  Filled 2014-06-17 (×2): qty 1

## 2014-06-17 MED ORDER — THIAMINE HCL 100 MG/ML IJ SOLN
100.0000 mg | Freq: Every day | INTRAMUSCULAR | Status: DC
  Filled 2014-06-17 (×2): qty 1

## 2014-06-17 MED ORDER — SODIUM CHLORIDE 0.9 % IV SOLN
INTRAVENOUS | Status: DC
  Administered 2014-06-17: 75 mL/h via INTRAVENOUS

## 2014-06-17 MED ORDER — VITAMIN B-1 100 MG PO TABS
100.0000 mg | ORAL_TABLET | Freq: Every day | ORAL | Status: DC
  Administered 2014-06-17 – 2014-06-18 (×2): 100 mg via ORAL
  Filled 2014-06-17 (×2): qty 1

## 2014-06-17 MED ORDER — FOLIC ACID 1 MG PO TABS
1.0000 mg | ORAL_TABLET | Freq: Every day | ORAL | Status: DC
  Administered 2014-06-17 – 2014-06-18 (×2): 1 mg via ORAL
  Filled 2014-06-17 (×2): qty 1

## 2014-06-17 MED ORDER — LORAZEPAM 2 MG/ML IJ SOLN: 1.0000 mg | Freq: Four times a day (QID) | INTRAMUSCULAR | Status: DC | PRN

## 2014-06-17 MED ORDER — ASPIRIN 81 MG PO CHEW
81.0000 mg | CHEWABLE_TABLET | Freq: Every day | ORAL | Status: DC
  Administered 2014-06-17 – 2014-06-18 (×2): 81 mg via ORAL
  Filled 2014-06-17 (×2): qty 1

## 2014-06-17 MED ORDER — SODIUM CHLORIDE 0.9 % IJ SOLN
3.0000 mL | INTRAMUSCULAR | Status: DC | PRN
  Administered 2014-06-17 (×2): 3 mL via INTRAVENOUS
  Filled 2014-06-17 (×2): qty 3

## 2014-06-17 MED ORDER — SODIUM CHLORIDE 0.9 % IJ SOLN: 3.0000 mL | Freq: Two times a day (BID) | INTRAMUSCULAR | Status: DC

## 2014-06-17 MED ORDER — POTASSIUM CHLORIDE CRYS ER 20 MEQ PO TBCR: 40.0000 meq | EXTENDED_RELEASE_TABLET | Freq: Every day | ORAL | Status: DC

## 2014-06-17 MED ORDER — ASPIRIN EC 325 MG PO TBEC: 325.0000 mg | DELAYED_RELEASE_TABLET | Freq: Every day | ORAL | Status: DC

## 2014-06-17 MED ORDER — POTASSIUM CHLORIDE CRYS ER 20 MEQ PO TBCR
40.0000 meq | EXTENDED_RELEASE_TABLET | Freq: Once | ORAL | Status: AC
Start: 2014-06-17 — End: 2014-06-17
  Administered 2014-06-17: 40 meq via ORAL
  Filled 2014-06-17: qty 2

## 2014-06-17 MED ORDER — LORAZEPAM 1 MG PO TABS
1.0000 mg | ORAL_TABLET | Freq: Four times a day (QID) | ORAL | Status: DC | PRN
  Administered 2014-06-18 (×3): 1 mg via ORAL
  Filled 2014-06-17 (×3): qty 1
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
## 2014-06-18 DIAGNOSIS — M795 Residual foreign body in soft tissue: Secondary | ICD-10-CM

## 2014-06-18 DIAGNOSIS — F419 Anxiety disorder, unspecified: Secondary | ICD-10-CM | POA: Diagnosis not present

## 2014-06-18 DIAGNOSIS — R0789 Other chest pain: Secondary | ICD-10-CM | POA: Diagnosis not present

## 2014-06-18 DIAGNOSIS — C880 Waldenstrom macroglobulinemia: Secondary | ICD-10-CM

## 2014-06-18 DIAGNOSIS — F1010 Alcohol abuse, uncomplicated: Secondary | ICD-10-CM | POA: Diagnosis not present

## 2014-06-18 DIAGNOSIS — D45 Polycythemia vera: Secondary | ICD-10-CM | POA: Diagnosis not present

## 2014-06-18 LAB — CBC
HCT: 55.7 % — ABNORMAL HIGH (ref 39.0–52.0)
HEMOGLOBIN: 19.1 g/dL — AB (ref 13.0–17.0)
MCH: 32.2 pg (ref 26.0–34.0)
MCHC: 34.3 g/dL (ref 30.0–36.0)
MCV: 93.8 fL (ref 78.0–100.0)
Platelets: 150 10*3/uL (ref 150–400)
RBC: 5.94 MIL/uL — ABNORMAL HIGH (ref 4.22–5.81)
RDW: 14.2 % (ref 11.5–15.5)
WBC: 5.4 10*3/uL (ref 4.0–10.5)

## 2014-06-18 LAB — HEMOGLOBIN AND HEMATOCRIT, BLOOD
HEMATOCRIT: 56 % — AB (ref 39.0–52.0)
Hemoglobin: 19.1 g/dL — ABNORMAL HIGH (ref 13.0–17.0)

## 2014-06-18 MED ORDER — ESOMEPRAZOLE MAGNESIUM 40 MG PO CPDR: 40.0000 mg | DELAYED_RELEASE_CAPSULE | Freq: Every day | ORAL | Status: DC | PRN

## 2014-06-18 MED ORDER — FOLIC ACID 1 MG PO TABS: 1.0000 mg | ORAL_TABLET | Freq: Every day | ORAL | Status: DC

## 2014-06-18 MED ORDER — ASPIRIN 81 MG PO CHEW: 81.0000 mg | CHEWABLE_TABLET | Freq: Every day | ORAL | Status: DC
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
## 2014-06-21 LAB — JAK2 GENOTYPR

## 2014-06-25 ENCOUNTER — Inpatient Hospital Stay: Payer: Medicaid Other | Admitting: Internal Medicine

## 2019-01-21 ENCOUNTER — Emergency Department (HOSPITAL_COMMUNITY): Payer: Medicaid - Out of State

## 2019-01-21 ENCOUNTER — Emergency Department (HOSPITAL_BASED_OUTPATIENT_CLINIC_OR_DEPARTMENT_OTHER): Payer: Medicaid - Out of State

## 2019-01-21 ENCOUNTER — Encounter (HOSPITAL_COMMUNITY): Payer: Self-pay | Admitting: Emergency Medicine

## 2019-01-21 ENCOUNTER — Other Ambulatory Visit: Payer: Self-pay

## 2019-01-21 ENCOUNTER — Emergency Department (HOSPITAL_COMMUNITY)
Admission: EM | Admit: 2019-01-21 | Discharge: 2019-01-21 | Disposition: A | Discharge status: Home / Self Care | Payer: Medicaid - Out of State | Attending: Emergency Medicine | Admitting: Emergency Medicine

## 2019-01-21 DIAGNOSIS — Z91010 Allergy to peanuts: Secondary | ICD-10-CM | POA: Diagnosis not present

## 2019-01-21 DIAGNOSIS — M79609 Pain in unspecified limb: Secondary | ICD-10-CM

## 2019-01-21 DIAGNOSIS — I2694 Multiple subsegmental pulmonary emboli without acute cor pulmonale: Secondary | ICD-10-CM | POA: Insufficient documentation

## 2019-01-21 DIAGNOSIS — R079 Chest pain, unspecified: Secondary | ICD-10-CM | POA: Diagnosis not present

## 2019-01-21 DIAGNOSIS — I82451 Acute embolism and thrombosis of right peroneal vein: Secondary | ICD-10-CM | POA: Diagnosis not present

## 2019-01-21 DIAGNOSIS — M79604 Pain in right leg: Secondary | ICD-10-CM | POA: Diagnosis present

## 2019-01-21 DIAGNOSIS — F17210 Nicotine dependence, cigarettes, uncomplicated: Secondary | ICD-10-CM | POA: Insufficient documentation

## 2019-01-21 LAB — BASIC METABOLIC PANEL
Anion gap: 11 (ref 5–15)
BUN: 6 mg/dL (ref 6–20)
CO2: 25 mmol/L (ref 22–32)
Calcium: 8.3 mg/dL — ABNORMAL LOW (ref 8.9–10.3)
Chloride: 103 mmol/L (ref 98–111)
Creatinine, Ser: 0.86 mg/dL (ref 0.61–1.24)
GFR calc Af Amer: >60 mL/min (ref >60)
GFR calc non Af Amer: >60 mL/min (ref >60)
Glucose, Bld: 77 mg/dL (ref 70–99)
Potassium: 3.7 mmol/L (ref 3.5–5.1)
Sodium: 139 mmol/L (ref 135–145)

## 2019-01-21 LAB — CBC WITH DIFFERENTIAL/PLATELET
Abs Immature Granulocytes: 0.01 10*3/uL (ref 0.00–0.07)
Basophils Absolute: 0 10*3/uL (ref 0.0–0.1)
Basophils Relative: 1 %
Eosinophils Absolute: 0.1 10*3/uL (ref 0.0–0.5)
Eosinophils Relative: 2 %
HCT: 54.8 % — ABNORMAL HIGH (ref 39.0–52.0)
Hemoglobin: 19.3 g/dL — ABNORMAL HIGH (ref 13.0–17.0)
Immature Granulocytes: 0 %
Lymphocytes Relative: 41 %
Lymphs Abs: 2 10*3/uL (ref 0.7–4.0)
MCH: 34.2 pg — ABNORMAL HIGH (ref 26.0–34.0)
MCHC: 35.2 g/dL (ref 30.0–36.0)
MCV: 97 fL (ref 80.0–100.0)
Monocytes Absolute: 0.6 10*3/uL (ref 0.1–1.0)
Monocytes Relative: 12 %
Neutro Abs: 2.1 10*3/uL (ref 1.7–7.7)
Neutrophils Relative %: 44 %
Platelets: UNDETERMINED 10*3/uL (ref 150–400)
RBC: 5.65 MIL/uL (ref 4.22–5.81)
RDW: 13.8 % (ref 11.5–15.5)
WBC: 4.9 10*3/uL (ref 4.0–10.5)
nRBC: 0 % (ref 0.0–0.2)

## 2019-01-21 LAB — TROPONIN I (HIGH SENSITIVITY)
Troponin I (High Sensitivity): 3 ng/L (ref <18)
Troponin I (High Sensitivity): 3 ng/L (ref <18)

## 2019-01-21 LAB — PROTIME-INR
INR: 0.9 (ref 0.8–1.2)
Prothrombin Time: 12.4 seconds (ref 11.4–15.2)

## 2019-01-21 MED ORDER — RIVAROXABAN 20 MG PO TABS: 20.0000 mg | ORAL_TABLET | Freq: Every day | ORAL | Status: DC

## 2019-01-21 MED ORDER — RIVAROXABAN 15 MG PO TABS
15.0000 mg | ORAL_TABLET | Freq: Two times a day (BID) | ORAL | Status: DC
  Filled 2019-01-21: qty 1

## 2019-01-21 MED ORDER — RIVAROXABAN 15 MG PO TABS
15.0000 mg | ORAL_TABLET | Freq: Two times a day (BID) | ORAL | Status: DC
  Administered 2019-01-21: 18:00:00 15 mg via ORAL
  Filled 2019-01-21 (×2): qty 1

## 2019-01-21 MED ORDER — RIVAROXABAN (XARELTO) VTE STARTER PACK (15 & 20 MG): ORAL_TABLET | ORAL | 0 refills | Status: DC

## 2019-01-21 MED ORDER — IOHEXOL 350 MG/ML SOLN
100.0000 mL | Freq: Once | INTRAVENOUS | Status: AC | PRN
  Administered 2019-01-21: 86 mL via INTRAVENOUS

## 2019-01-21 MED ORDER — RIVAROXABAN (XARELTO) EDUCATION KIT FOR DVT/PE PATIENTS
PACK | Freq: Once | Status: DC
  Filled 2019-01-21: qty 1
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
## 2019-01-21 NOTE — Progress Notes (Signed)
VASCULAR LAB PRELIMINARY  PRELIMINARY  PRELIMINARY  PRELIMINARY  Right lower extremity venous duplex completed.    Preliminary report:  See CV proc for preliminary results.   Gave Madilyn Hook, PA-C report.  Delanie Tirrell, RVT 01/21/2019, 12:57 PM

## 2019-01-21 NOTE — ED Notes (Signed)
Pt dc'd home w/all belongings, a/o x4, ambulatory on dc

## 2019-01-21 NOTE — Progress Notes (Signed)
Xarelto 15 mg po bid x 21 days followed by 20 mg po once daily Education completed with patient.
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
## 2019-01-21 NOTE — ED Triage Notes (Signed)
C/o R calf pain and swelling x 3 days.  States "I have thick blood."  Pt states he is supposed to be taking blood thinner but only took it for 1 month and hasn't had it the last 3 months.  Pain worse when standing.

## 2019-01-21 NOTE — ED Notes (Signed)
Vascular at bedside

## 2019-01-22 ENCOUNTER — Other Ambulatory Visit (HOSPITAL_COMMUNITY): Payer: Self-pay | Admitting: Physician Assistant

## 2019-01-22 ENCOUNTER — Telehealth: Payer: Self-pay | Admitting: *Deleted

## 2019-01-22 MED ORDER — RIVAROXABAN (XARELTO) VTE STARTER PACK (15 & 20 MG): ORAL_TABLET | ORAL | 0 refills | Status: AC

## 2019-01-22 NOTE — Telephone Encounter (Signed)
Pt called regarding Rx not covered in [place].  EDCM contacted EDP to send Rx to [facility].

## 2019-01-31 ENCOUNTER — Emergency Department (HOSPITAL_COMMUNITY): Payer: Medicaid - Out of State

## 2019-01-31 ENCOUNTER — Other Ambulatory Visit: Payer: Self-pay

## 2019-01-31 ENCOUNTER — Encounter (HOSPITAL_COMMUNITY): Payer: Self-pay

## 2019-01-31 ENCOUNTER — Encounter (HOSPITAL_COMMUNITY): Payer: Self-pay | Admitting: Emergency Medicine

## 2019-01-31 ENCOUNTER — Emergency Department (HOSPITAL_COMMUNITY)
Admission: EM | Admit: 2019-01-31 | Discharge: 2019-02-01 | Disposition: A | Discharge status: Home / Self Care | Payer: Medicaid - Out of State | Source: Home / Self Care | Attending: Emergency Medicine | Admitting: Emergency Medicine

## 2019-01-31 ENCOUNTER — Emergency Department (HOSPITAL_COMMUNITY)
Admission: EM | Admit: 2019-01-31 | Discharge: 2019-01-31 | Discharge status: Against Medical Advice | Payer: Medicaid - Out of State | Attending: Emergency Medicine | Admitting: Emergency Medicine

## 2019-01-31 DIAGNOSIS — Z7901 Long term (current) use of anticoagulants: Secondary | ICD-10-CM | POA: Insufficient documentation

## 2019-01-31 DIAGNOSIS — Z86718 Personal history of other venous thrombosis and embolism: Secondary | ICD-10-CM | POA: Insufficient documentation

## 2019-01-31 DIAGNOSIS — F1010 Alcohol abuse, uncomplicated: Secondary | ICD-10-CM | POA: Diagnosis not present

## 2019-01-31 DIAGNOSIS — Z86711 Personal history of pulmonary embolism: Secondary | ICD-10-CM | POA: Diagnosis not present

## 2019-01-31 DIAGNOSIS — R079 Chest pain, unspecified: Secondary | ICD-10-CM | POA: Insufficient documentation

## 2019-01-31 DIAGNOSIS — F17210 Nicotine dependence, cigarettes, uncomplicated: Secondary | ICD-10-CM | POA: Insufficient documentation

## 2019-01-31 DIAGNOSIS — R0789 Other chest pain: Secondary | ICD-10-CM | POA: Diagnosis present

## 2019-01-31 HISTORY — DX: Alcohol abuse, uncomplicated: F10.10

## 2019-01-31 HISTORY — DX: Acute embolism and thrombosis of unspecified deep veins of unspecified lower extremity: I82.409

## 2019-01-31 LAB — BASIC METABOLIC PANEL
Anion gap: 13 (ref 5–15)
BUN: <5 mg/dL — ABNORMAL LOW (ref 6–20)
CO2: 24 mmol/L (ref 22–32)
Calcium: 9 mg/dL (ref 8.9–10.3)
Chloride: 102 mmol/L (ref 98–111)
Creatinine, Ser: 0.79 mg/dL (ref 0.61–1.24)
GFR calc Af Amer: >60 mL/min (ref >60)
GFR calc non Af Amer: >60 mL/min (ref >60)
Glucose, Bld: 79 mg/dL (ref 70–99)
Potassium: 3.3 mmol/L — ABNORMAL LOW (ref 3.5–5.1)
Sodium: 139 mmol/L (ref 135–145)

## 2019-01-31 LAB — CBC
HCT: 60.9 % — ABNORMAL HIGH (ref 39.0–52.0)
HCT: 60.4 % — ABNORMAL HIGH (ref 39.0–52.0)
Hemoglobin: 21.6 g/dL (ref 13.0–17.0)
Hemoglobin: 20.8 g/dL — ABNORMAL HIGH (ref 13.0–17.0)
MCH: 34.1 pg — ABNORMAL HIGH (ref 26.0–34.0)
MCH: 33.5 pg (ref 26.0–34.0)
MCHC: 35.5 g/dL (ref 30.0–36.0)
MCHC: 34.4 g/dL (ref 30.0–36.0)
MCV: 96.2 fL (ref 80.0–100.0)
MCV: 97.3 fL (ref 80.0–100.0)
Platelets: 198 10*3/uL (ref 150–400)
Platelets: 188 10*3/uL (ref 150–400)
RBC: 6.33 MIL/uL — ABNORMAL HIGH (ref 4.22–5.81)
RBC: 6.21 MIL/uL — ABNORMAL HIGH (ref 4.22–5.81)
RDW: 14.5 % (ref 11.5–15.5)
RDW: 14.6 % (ref 11.5–15.5)
WBC: 4.4 10*3/uL (ref 4.0–10.5)
WBC: 4.4 10*3/uL (ref 4.0–10.5)
nRBC: 0 % (ref 0.0–0.2)
nRBC: 0 % (ref 0.0–0.2)

## 2019-01-31 LAB — TROPONIN I (HIGH SENSITIVITY)
Troponin I (High Sensitivity): <2 ng/L (ref <18)
Troponin I (High Sensitivity): <2 ng/L (ref <18)

## 2019-01-31 MED ORDER — SODIUM CHLORIDE 0.9% FLUSH: 3.0000 mL | Freq: Once | INTRAVENOUS | Status: DC

## 2019-01-31 MED ORDER — RIVAROXABAN 15 MG PO TABS
15.0000 mg | ORAL_TABLET | Freq: Once | ORAL | Status: AC
  Administered 2019-02-01: 15 mg via ORAL
  Filled 2019-01-31 (×2): qty 1

## 2019-01-31 MED ORDER — CHLORDIAZEPOXIDE HCL 25 MG PO CAPS
50.0000 mg | ORAL_CAPSULE | Freq: Once | ORAL | Status: AC
  Administered 2019-02-01: 50 mg via ORAL
  Filled 2019-01-31: qty 2

## 2019-01-31 NOTE — ED Triage Notes (Signed)
Pt states he was seen in ED on 1/9 with RLE DVT.    C/o tightness in center of chest and feeling lightheaded since yesterday.  Pt states he normally drinks a bottle of liquor + beers daily and hasn't had ETOH in 24 hours.

## 2019-01-31 NOTE — ED Notes (Signed)
Called ac for med 
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
## 2019-02-01 MED ORDER — CHLORDIAZEPOXIDE HCL 25 MG PO CAPS: ORAL_CAPSULE | ORAL | 0 refills | Status: AC
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
